# Patient Record
Sex: Female | Born: 1985
Health system: Southern US, Community
[De-identification: ages and names within clinical notes are randomized; demographics above are authoritative.]

## PROBLEM LIST (undated history)

## (undated) DIAGNOSIS — F329 Major depressive disorder, single episode, unspecified: Secondary | ICD-10-CM

## (undated) DIAGNOSIS — R630 Anorexia: Secondary | ICD-10-CM

## (undated) DIAGNOSIS — D649 Anemia, unspecified: Secondary | ICD-10-CM

## (undated) DIAGNOSIS — J189 Pneumonia, unspecified organism: Secondary | ICD-10-CM

## (undated) DIAGNOSIS — R5383 Other fatigue: Secondary | ICD-10-CM

## (undated) DIAGNOSIS — E611 Iron deficiency: Secondary | ICD-10-CM

## (undated) DIAGNOSIS — F32A Depression, unspecified: Secondary | ICD-10-CM

## (undated) DIAGNOSIS — F419 Anxiety disorder, unspecified: Secondary | ICD-10-CM

## (undated) HISTORY — PX: TUBAL LIGATION: SHX77

---

## 2010-09-02 ENCOUNTER — Emergency Department (HOSPITAL_BASED_OUTPATIENT_CLINIC_OR_DEPARTMENT_OTHER)
Admission: EM | Admit: 2010-09-02 | Discharge: 2010-09-02 | Disposition: A | Discharge status: Home / Self Care | Payer: Medicaid Other | Attending: Emergency Medicine | Admitting: Emergency Medicine

## 2010-09-02 DIAGNOSIS — K089 Disorder of teeth and supporting structures, unspecified: Secondary | ICD-10-CM | POA: Insufficient documentation

## 2010-09-02 DIAGNOSIS — K029 Dental caries, unspecified: Secondary | ICD-10-CM | POA: Insufficient documentation

## 2010-11-28 ENCOUNTER — Emergency Department (HOSPITAL_BASED_OUTPATIENT_CLINIC_OR_DEPARTMENT_OTHER)
Admission: EM | Admit: 2010-11-28 | Discharge: 2010-11-28 | Disposition: A | Discharge status: Home / Self Care | Payer: Medicaid Other | Attending: Emergency Medicine | Admitting: Emergency Medicine

## 2010-11-28 DIAGNOSIS — J45909 Unspecified asthma, uncomplicated: Secondary | ICD-10-CM | POA: Insufficient documentation

## 2010-11-28 DIAGNOSIS — K089 Disorder of teeth and supporting structures, unspecified: Secondary | ICD-10-CM | POA: Insufficient documentation

## 2010-11-28 DIAGNOSIS — K029 Dental caries, unspecified: Secondary | ICD-10-CM | POA: Insufficient documentation

## 2011-08-10 ENCOUNTER — Emergency Department (HOSPITAL_BASED_OUTPATIENT_CLINIC_OR_DEPARTMENT_OTHER)
Admission: EM | Admit: 2011-08-10 | Discharge: 2011-08-10 | Disposition: A | Discharge status: Home / Self Care | Payer: Medicaid Other | Attending: Emergency Medicine | Admitting: Emergency Medicine

## 2011-08-10 ENCOUNTER — Encounter (HOSPITAL_BASED_OUTPATIENT_CLINIC_OR_DEPARTMENT_OTHER): Payer: Self-pay

## 2011-08-10 DIAGNOSIS — J45909 Unspecified asthma, uncomplicated: Secondary | ICD-10-CM | POA: Insufficient documentation

## 2011-08-10 DIAGNOSIS — H9209 Otalgia, unspecified ear: Secondary | ICD-10-CM | POA: Insufficient documentation

## 2011-08-10 DIAGNOSIS — K089 Disorder of teeth and supporting structures, unspecified: Secondary | ICD-10-CM | POA: Insufficient documentation

## 2011-08-10 DIAGNOSIS — R220 Localized swelling, mass and lump, head: Secondary | ICD-10-CM | POA: Insufficient documentation

## 2011-08-10 DIAGNOSIS — K029 Dental caries, unspecified: Secondary | ICD-10-CM | POA: Insufficient documentation

## 2011-08-10 MED ORDER — HYDROCODONE-ACETAMINOPHEN 5-500 MG PO TABS: 1.0000 | ORAL_TABLET | Freq: Four times a day (QID) | ORAL | Status: AC | PRN

## 2011-08-10 MED ORDER — BUPIVACAINE-EPINEPHRINE PF 0.5-1:200000 % IJ SOLN
INTRAMUSCULAR | Status: AC
  Administered 2011-08-10: 9 mg
  Filled 2011-08-10: qty 1.8

## 2011-08-10 MED ORDER — BENZOCAINE 20 % MT SOLN
OROMUCOSAL | Status: AC
  Administered 2011-08-10: 10:00:00
  Filled 2011-08-10: qty 57

## 2011-08-10 NOTE — ED Provider Notes (Signed)
History     CSN: [id number]  Arrival date & time 08/10/11  0847   First MD Initiated Contact with Patient 08/10/11 [phone]      Chief Complaint  Patient presents with  . Dental Pain    (Consider location/radiation/quality/duration/timing/severity/associated sxs/prior treatment) Patient is a 26 y.o. female presenting with tooth pain. The history is provided by the patient.  Dental PainThe primary symptoms include mouth pain. Primary symptoms do not include fever, sore throat or cough. Primary symptoms comment: She went to another hospital the last 2 days and was given penicillin and pain medication she states in the past she had a shot in her mouth which improved the pain The symptoms began 2 days ago. The symptoms are worsening. The symptoms are recurrent. The symptoms occur constantly.  Additional symptoms include: dental sensitivity to temperature, gum swelling, gum tenderness and ear pain. Additional symptoms do not include: trouble swallowing, pain with swallowing and drooling. Medical issues include: smoking.    Past Medical History  Diagnosis Date  . Asthma     Past Surgical History  Procedure Date  . Cesarean section     No family history on file.  History  Substance Use Topics  . Smoking status: Current Everyday Smoker  . Smokeless tobacco: Not on file  . Alcohol Use: Yes    OB History    Grav Para Term Preterm Abortions TAB SAB Ect Mult Living                  Review of Systems  Constitutional: Negative for fever.  HENT: Positive for ear pain. Negative for sore throat, drooling and trouble swallowing.   Respiratory: Negative for cough.   All other systems reviewed and are negative.    Allergies  Percocet  Home Medications   Current Outpatient Rx  Name Route Sig Dispense Refill  . HYDROCODONE-ACETAMINOPHEN 5-500 MG PO TABS Oral Take 1 tablet by mouth every 4 (four) hours as needed.    Marland Kitchen PENICILLIN V POTASSIUM 500 MG PO TABS Oral Take 500 mg by mouth 3  (three) times daily.      BP 147/83  Pulse 89  Temp(Src) 98.3 F (36.8 C) (Oral)  Resp 16  Ht 5\' 7"  (1.702 m)  Wt 175 lb (79.379 kg)  BMI 27.41 kg/m2  SpO2 100%  LMP 08/04/2011  Physical Exam  Nursing note and vitals reviewed. Constitutional: She is oriented to person, place, and time. She appears well-developed and well-nourished. She appears distressed.  HENT:  Head: Normocephalic and atraumatic. No trismus in the jaw.  Mouth/Throat: Oropharynx is clear and moist. Dental caries present. No dental abscesses or uvula swelling. No tonsillar abscesses.    Eyes: Conjunctivae and EOM are normal. Pupils are equal, round, and reactive to light.  Lymphadenopathy:    She has no cervical adenopathy.  Neurological: She is alert and oriented to person, place, and time.  Skin: Skin is warm and dry.    ED Course  Dental Date/Time: 08/10/2011 9:50 AM Performed by: Gwyneth Sprout Authorized by: Gwyneth Sprout Consent: Verbal consent obtained. Consent given by: patient Local anesthesia used: yes Anesthesia: local infiltration Local anesthetic: bupivacaine 0.25% without epinephrine and topical anesthetic Anesthetic total: 2 ml Patient sedated: no Patient tolerance: Patient tolerated the procedure well with no immediate complications. Comments: Significant improvement in dental pain   (including critical care time)  Labs Reviewed - No data to display No results found.   No diagnosis found.    MDM   Pt  with dental caries and mild facial swelling.  No signs of ludwig's angina or difficulty swallowing and no systemic symptoms. Moderate relief of dental pain after a dental block. Patient is to continue taking the penicillin and will followup with dentistry.         Gwyneth Sprout, MD 08/10/11 [phone]

## 2011-08-10 NOTE — ED Notes (Signed)
Pt c/o Lower L dental pain.  Pt states she has been to [facility] past two days and given Vicodin and Penicillin.  Pt states she was seen here before and given injection that alleviated pain.

## 2011-11-12 ENCOUNTER — Emergency Department (HOSPITAL_BASED_OUTPATIENT_CLINIC_OR_DEPARTMENT_OTHER)
Admission: EM | Admit: 2011-11-12 | Discharge: 2011-11-12 | Disposition: A | Discharge status: Home / Self Care | Payer: Medicaid Other | Attending: Emergency Medicine | Admitting: Emergency Medicine

## 2011-11-12 ENCOUNTER — Encounter (HOSPITAL_BASED_OUTPATIENT_CLINIC_OR_DEPARTMENT_OTHER): Payer: Self-pay | Admitting: Emergency Medicine

## 2011-11-12 DIAGNOSIS — J45909 Unspecified asthma, uncomplicated: Secondary | ICD-10-CM | POA: Insufficient documentation

## 2011-11-12 DIAGNOSIS — F17200 Nicotine dependence, unspecified, uncomplicated: Secondary | ICD-10-CM | POA: Insufficient documentation

## 2011-11-12 DIAGNOSIS — R599 Enlarged lymph nodes, unspecified: Secondary | ICD-10-CM | POA: Insufficient documentation

## 2011-11-12 DIAGNOSIS — J029 Acute pharyngitis, unspecified: Secondary | ICD-10-CM | POA: Insufficient documentation

## 2011-11-12 HISTORY — DX: Pneumonia, unspecified organism: J18.9

## 2011-11-12 MED ORDER — CEPHALEXIN 500 MG PO CAPS: 500.0000 mg | ORAL_CAPSULE | Freq: Three times a day (TID) | ORAL | Status: AC

## 2011-11-12 MED ORDER — PREDNISONE 10 MG PO TABS: 20.0000 mg | ORAL_TABLET | Freq: Two times a day (BID) | ORAL | Status: DC

## 2011-11-12 NOTE — ED Provider Notes (Signed)
History     CSN: [id number]  Arrival date & time 11/12/11  1002   First MD Initiated Contact with Patient 11/12/11 1130      Chief Complaint  Patient presents with  . Sore Throat    (Consider location/radiation/quality/duration/timing/severity/associated sxs/prior treatment) Patient is a 26 y.o. female presenting with pharyngitis. The history is provided by the patient.  Sore Throat This is a new problem. Episode onset: one week ago. The problem occurs constantly. The problem has been gradually worsening. Pertinent negatives include no shortness of breath. The symptoms are aggravated by swallowing. The symptoms are relieved by nothing. She has tried ASA for the symptoms. The treatment provided no relief.    Past Medical History  Diagnosis Date  . Asthma   . Pneumonia     Past Surgical History  Procedure Date  . Cesarean section     No family history on file.  History  Substance Use Topics  . Smoking status: Current Everyday Smoker  . Smokeless tobacco: Not on file  . Alcohol Use: Yes    OB History    Grav Para Term Preterm Abortions TAB SAB Ect Mult Living                  Review of Systems  Respiratory: Negative for shortness of breath.   All other systems reviewed and are negative.    Allergies  Percocet  Home Medications   Current Outpatient Rx  Name Route Sig Dispense Refill  . HYDROCODONE-ACETAMINOPHEN 5-500 MG PO TABS Oral Take 1 tablet by mouth every 4 (four) hours as needed.    Marland Kitchen PENICILLIN V POTASSIUM 500 MG PO TABS Oral Take 500 mg by mouth 3 (three) times daily.      BP 122/72  Pulse 89  Temp(Src) 98.9 F (37.2 C) (Oral)  Resp 20  SpO2 100%  LMP 10/14/2011  Physical Exam  Nursing note and vitals reviewed. Constitutional: She is oriented to person, place, and time. She appears well-developed and well-nourished. No distress.  HENT:  Head: Normocephalic and atraumatic.  Right Ear: External ear normal.  Left Ear: External ear normal.   Mouth/Throat: Oropharynx is clear and moist.  Neck: Normal range of motion. Neck supple.  Cardiovascular: Normal rate and regular rhythm.  Exam reveals no gallop and no friction rub.   No murmur heard. Pulmonary/Chest: Effort normal and breath sounds normal. No respiratory distress. She has no wheezes.  Abdominal: Soft. Bowel sounds are normal. She exhibits no distension. There is no tenderness.  Musculoskeletal: Normal range of motion.  Lymphadenopathy:    She has cervical adenopathy.  Neurological: She is alert and oriented to person, place, and time.  Skin: Skin is warm and dry. She is not diaphoretic.    ED Course  Procedures (including critical care time)   Labs Reviewed  RAPID STREP SCREEN   No results found.   No diagnosis found.    MDM          Geoffery Lyons, MD 11/12/11 [phone]

## 2011-11-12 NOTE — ED Notes (Signed)
Pt c/o sore throat x 1 week

## 2011-11-12 NOTE — Discharge Instructions (Signed)

## 2012-06-17 ENCOUNTER — Emergency Department (HOSPITAL_BASED_OUTPATIENT_CLINIC_OR_DEPARTMENT_OTHER)
Admission: EM | Admit: 2012-06-17 | Discharge: 2012-06-17 | Discharge status: Against Medical Advice | Payer: Self-pay | Attending: Emergency Medicine | Admitting: Emergency Medicine

## 2012-06-17 ENCOUNTER — Encounter (HOSPITAL_BASED_OUTPATIENT_CLINIC_OR_DEPARTMENT_OTHER): Payer: Self-pay | Admitting: Family Medicine

## 2012-06-17 DIAGNOSIS — L293 Anogenital pruritus, unspecified: Secondary | ICD-10-CM | POA: Insufficient documentation

## 2012-06-17 LAB — URINALYSIS, ROUTINE W REFLEX MICROSCOPIC
Bilirubin Urine: NEGATIVE
Glucose, UA: NEGATIVE mg/dL
Ketones, ur: NEGATIVE mg/dL
pH: 5.5 (ref 5.0–8.0)

## 2012-06-17 NOTE — ED Notes (Signed)
Pt c/o vaginal itching and discharge x 2 days. Pt sts she tried monistat last night.

## 2012-06-18 ENCOUNTER — Encounter (HOSPITAL_BASED_OUTPATIENT_CLINIC_OR_DEPARTMENT_OTHER): Payer: Self-pay | Admitting: *Deleted

## 2012-06-18 ENCOUNTER — Emergency Department (HOSPITAL_BASED_OUTPATIENT_CLINIC_OR_DEPARTMENT_OTHER)
Admission: EM | Admit: 2012-06-18 | Discharge: 2012-06-18 | Disposition: A | Discharge status: Home / Self Care | Payer: Medicaid Other | Attending: Emergency Medicine | Admitting: Emergency Medicine

## 2012-06-18 DIAGNOSIS — J45909 Unspecified asthma, uncomplicated: Secondary | ICD-10-CM | POA: Insufficient documentation

## 2012-06-18 DIAGNOSIS — Z8701 Personal history of pneumonia (recurrent): Secondary | ICD-10-CM | POA: Insufficient documentation

## 2012-06-18 DIAGNOSIS — B3731 Acute candidiasis of vulva and vagina: Secondary | ICD-10-CM | POA: Insufficient documentation

## 2012-06-18 DIAGNOSIS — Z3202 Encounter for pregnancy test, result negative: Secondary | ICD-10-CM | POA: Insufficient documentation

## 2012-06-18 DIAGNOSIS — F17200 Nicotine dependence, unspecified, uncomplicated: Secondary | ICD-10-CM | POA: Insufficient documentation

## 2012-06-18 DIAGNOSIS — B373 Candidiasis of vulva and vagina: Secondary | ICD-10-CM

## 2012-06-18 DIAGNOSIS — Z79899 Other long term (current) drug therapy: Secondary | ICD-10-CM | POA: Insufficient documentation

## 2012-06-18 DIAGNOSIS — N898 Other specified noninflammatory disorders of vagina: Secondary | ICD-10-CM | POA: Insufficient documentation

## 2012-06-18 DIAGNOSIS — N760 Acute vaginitis: Secondary | ICD-10-CM | POA: Insufficient documentation

## 2012-06-18 LAB — WET PREP, GENITAL

## 2012-06-18 LAB — URINALYSIS, ROUTINE W REFLEX MICROSCOPIC
Glucose, UA: NEGATIVE mg/dL
Leukocytes, UA: NEGATIVE
pH: 5 (ref 5.0–8.0)

## 2012-06-18 MED ORDER — METRONIDAZOLE 500 MG PO TABS: 500.0000 mg | ORAL_TABLET | Freq: Two times a day (BID) | ORAL | Status: DC

## 2012-06-18 MED ORDER — FLUCONAZOLE 150 MG PO TABS: 150.0000 mg | ORAL_TABLET | Freq: Once | ORAL | Status: DC

## 2012-06-18 NOTE — ED Notes (Signed)
Pelvic cart is set up and ready for the doctor to use. The patient is undressed from the waist down and in a gown.

## 2012-06-18 NOTE — ED Notes (Signed)
PA at bedside.

## 2012-06-18 NOTE — ED Provider Notes (Signed)
History     CSN: [id number]  Arrival date & time 06/18/12  1332   First MD Initiated Contact with Patient 06/18/12 1354      Chief Complaint  Patient presents with  . Vaginal Itching    (Consider location/radiation/quality/duration/timing/severity/associated sxs/prior treatment) HPI Comments: Patient is a 26 year old female who presents with a 2 day history of vaginal itching and burning. Patient reports gradual onset and progressive worsening of symptoms since the onset. Patient has not tried anything for symptoms. No aggravating/alleviating factors. Associated symptoms include vaginal discharge.   Patient is a 26 y.o. female presenting with vaginal itching.  Vaginal Itching    Past Medical History  Diagnosis Date  . Asthma   . Pneumonia     Past Surgical History  Procedure Date  . Cesarean section     No family history on file.  History  Substance Use Topics  . Smoking status: Current Every Day Smoker -- 1.0 packs/day    Types: Cigarettes  . Smokeless tobacco: Not on file  . Alcohol Use: Yes    OB History    Grav Para Term Preterm Abortions TAB SAB Ect Mult Living                  Review of Systems  Genitourinary: Positive for vaginal discharge.  All other systems reviewed and are negative.    Allergies  Percocet  Home Medications   Current Outpatient Rx  Name  Route  Sig  Dispense  Refill  . FLUCONAZOLE 150 MG PO TABS   Oral   Take 1 tablet (150 mg total) by mouth once.   1 tablet   0   . HYDROCODONE-ACETAMINOPHEN 5-500 MG PO TABS   Oral   Take 1 tablet by mouth every 4 (four) hours as needed.         Marland Kitchen METRONIDAZOLE 500 MG PO TABS   Oral   Take 1 tablet (500 mg total) by mouth 2 (two) times daily.   14 tablet   0   . PENICILLIN V POTASSIUM 500 MG PO TABS   Oral   Take 500 mg by mouth 3 (three) times daily.         Marland Kitchen PREDNISONE 10 MG PO TABS   Oral   Take 2 tablets (20 mg total) by mouth 2 (two) times daily.   12 tablet    0     BP 125/64  Pulse 88  Temp 98.8 F (37.1 C) (Oral)  Resp 18  SpO2 100%  LMP 05/18/2012  Physical Exam  Nursing note and vitals reviewed. Constitutional: She is oriented to person, place, and time. She appears well-developed and well-nourished. No distress.  HENT:  Head: Normocephalic and atraumatic.  Eyes: Conjunctivae normal are normal.  Neck: Normal range of motion. Neck supple.  Cardiovascular: Normal rate and regular rhythm.  Exam reveals no gallop and no friction rub.   No murmur heard. Pulmonary/Chest: Effort normal and breath sounds normal. She has no wheezes. She has no rales. She exhibits no tenderness.  Abdominal: Soft. She exhibits no distension. There is no tenderness. There is no rebound and no guarding.  Genitourinary:       Copious white vaginal discharge. No cervical motion tenderness. No adnexal tenderness, masses palpated on bimanual exam.  Musculoskeletal: Normal range of motion.  Neurological: She is alert and oriented to person, place, and time. Coordination normal.       Speech is goal-oriented. Moves limbs without ataxia.  Skin: Skin is warm and dry. She is not diaphoretic.  Psychiatric: She has a normal mood and affect. Her behavior is normal.    ED Course  Procedures (including critical care time)  Labs Reviewed  WET PREP, GENITAL - Abnormal; Notable for the following:    Yeast Wet Prep HPF POC MANY (*)     Clue Cells Wet Prep HPF POC MODERATE (*)     WBC, Wet Prep HPF POC MANY (*)     All other components within normal limits  PREGNANCY, URINE  URINALYSIS, ROUTINE W REFLEX MICROSCOPIC  GC/CHLAMYDIA PROBE AMP   No results found.   1. Yeast vaginitis   2. BV (bacterial vaginosis)       MDM  3:31 PM Patient;s wet prep results show yeast and clue cells. I will treat her for both infections. Patient will be discharged and instructed to return with worsening or concerning symptoms.         Emilia Beck, PA-C 06/18/12  1538

## 2012-06-18 NOTE — ED Provider Notes (Signed)
Medical screening examination/treatment/procedure(s) were performed by non-physician practitioner and as supervising physician I was immediately available for consultation/collaboration.  Jones Skene, M.D.     Jones Skene, MD 06/18/12 1639

## 2012-06-18 NOTE — ED Notes (Signed)
Vaginal itching and burning x 2 days.

## 2012-09-10 ENCOUNTER — Emergency Department (HOSPITAL_BASED_OUTPATIENT_CLINIC_OR_DEPARTMENT_OTHER)
Admission: EM | Admit: 2012-09-10 | Discharge: 2012-09-10 | Disposition: A | Discharge status: Home / Self Care | Payer: Medicaid Other | Attending: Emergency Medicine | Admitting: Emergency Medicine

## 2012-09-10 ENCOUNTER — Encounter (HOSPITAL_BASED_OUTPATIENT_CLINIC_OR_DEPARTMENT_OTHER): Payer: Self-pay | Admitting: *Deleted

## 2012-09-10 DIAGNOSIS — Z8701 Personal history of pneumonia (recurrent): Secondary | ICD-10-CM | POA: Insufficient documentation

## 2012-09-10 DIAGNOSIS — Z79899 Other long term (current) drug therapy: Secondary | ICD-10-CM | POA: Insufficient documentation

## 2012-09-10 DIAGNOSIS — K0889 Other specified disorders of teeth and supporting structures: Secondary | ICD-10-CM

## 2012-09-10 DIAGNOSIS — F17200 Nicotine dependence, unspecified, uncomplicated: Secondary | ICD-10-CM | POA: Insufficient documentation

## 2012-09-10 DIAGNOSIS — J45909 Unspecified asthma, uncomplicated: Secondary | ICD-10-CM | POA: Insufficient documentation

## 2012-09-10 DIAGNOSIS — K089 Disorder of teeth and supporting structures, unspecified: Secondary | ICD-10-CM | POA: Insufficient documentation

## 2012-09-10 MED ORDER — HYDROCODONE-ACETAMINOPHEN 5-325 MG PO TABS
ORAL_TABLET | ORAL | Status: AC
  Administered 2012-09-10: 17:00:00
  Filled 2012-09-10: qty 1

## 2012-09-10 MED ORDER — HYDROCODONE-ACETAMINOPHEN 5-325 MG PO TABS: 2.0000 | ORAL_TABLET | ORAL | Status: DC | PRN

## 2012-09-10 NOTE — ED Provider Notes (Signed)
History     CSN: [id number]  Arrival date & time 09/10/12  1630   None     Chief Complaint  Patient presents with  . Dental Pain    (Consider location/radiation/quality/duration/timing/severity/associated sxs/prior treatment) HPI Comments: Pt states that her doctor put her on amoxicillin in the last couple of days  Patient is a 27 y.o. female presenting with tooth pain. The history is provided by the patient. No language interpreter was used.  Dental PainThe primary symptoms include mouth pain. Primary symptoms do not include fever. The symptoms began 2 days ago. The symptoms are unchanged. The symptoms occur constantly.  Medical issues include: smoking.    Past Medical History  Diagnosis Date  . Asthma   . Pneumonia     Past Surgical History  Procedure Laterality Date  . Cesarean section      No family history on file.  History  Substance Use Topics  . Smoking status: Current Every Day Smoker -- 1.00 packs/day    Types: Cigarettes  . Smokeless tobacco: Not on file  . Alcohol Use: Yes    OB History   Grav Para Term Preterm Abortions TAB SAB Ect Mult Living                  Review of Systems  Constitutional: Negative for fever.  Respiratory: Negative.   Cardiovascular: Negative.     Allergies  Percocet  Home Medications   Current Outpatient Rx  Name  Route  Sig  Dispense  Refill  . fluconazole (DIFLUCAN) 150 MG tablet   Oral   Take 1 tablet (150 mg total) by mouth once.   1 tablet   0   . HYDROcodone-acetaminophen (NORCO/VICODIN) 5-325 MG per tablet   Oral   Take 2 tablets by mouth every 4 (four) hours as needed for pain.   10 tablet   0   . HYDROcodone-acetaminophen (VICODIN) 5-500 MG per tablet   Oral   Take 1 tablet by mouth every 4 (four) hours as needed.         . metroNIDAZOLE (FLAGYL) 500 MG tablet   Oral   Take 1 tablet (500 mg total) by mouth 2 (two) times daily.   14 tablet   0   . penicillin v potassium (VEETID) 500 MG  tablet   Oral   Take 500 mg by mouth 3 (three) times daily.         . predniSONE (DELTASONE) 10 MG tablet   Oral   Take 2 tablets (20 mg total) by mouth 2 (two) times daily.   12 tablet   0     BP 148/100  Pulse 78  Temp(Src) 98 F (36.7 C) (Oral)  Resp 20  Wt 179 lb (81.194 kg)  BMI 28.03 kg/m2  SpO2 100%  Physical Exam  Nursing note and vitals reviewed. Constitutional: She appears well-developed and well-nourished.  HENT:  Right Ear: External ear normal.  Left Ear: External ear normal.  Pt has multiple decayed and broken teeth  Cardiovascular: Normal rate and regular rhythm.   Pulmonary/Chest: Effort normal and breath sounds normal.    ED Course  Procedures (including critical care time)  Labs Reviewed - No data to display No results found.   1. Toothache       MDM  Pt already on antibiotics;will give hydrocodone and referral to dentist        Teressa Lower, NP 09/10/12 1712

## 2012-09-10 NOTE — ED Notes (Signed)
Toothache x 2 days 

## 2012-09-10 NOTE — ED Notes (Signed)
Pt reports she was seen by PMD yesterday, diagnosed with a dental abscess, given an Amoxicillin and Percocet but continues to have pain.

## 2012-09-11 NOTE — ED Provider Notes (Signed)
Medical screening examination/treatment/procedure(s) were performed by non-physician practitioner and as supervising physician I was immediately available for consultation/collaboration.  Geoffery Lyons, MD 09/11/12 [phone]

## 2012-11-09 ENCOUNTER — Emergency Department (HOSPITAL_BASED_OUTPATIENT_CLINIC_OR_DEPARTMENT_OTHER)
Admission: EM | Admit: 2012-11-09 | Discharge: 2012-11-09 | Disposition: A | Discharge status: Home / Self Care | Payer: Medicaid Other | Attending: Emergency Medicine | Admitting: Emergency Medicine

## 2012-11-09 ENCOUNTER — Encounter (HOSPITAL_BASED_OUTPATIENT_CLINIC_OR_DEPARTMENT_OTHER): Payer: Self-pay | Admitting: *Deleted

## 2012-11-09 DIAGNOSIS — N949 Unspecified condition associated with female genital organs and menstrual cycle: Secondary | ICD-10-CM | POA: Insufficient documentation

## 2012-11-09 DIAGNOSIS — IMO0002 Reserved for concepts with insufficient information to code with codable children: Secondary | ICD-10-CM | POA: Insufficient documentation

## 2012-11-09 DIAGNOSIS — R82998 Other abnormal findings in urine: Secondary | ICD-10-CM | POA: Insufficient documentation

## 2012-11-09 DIAGNOSIS — Z3202 Encounter for pregnancy test, result negative: Secondary | ICD-10-CM | POA: Insufficient documentation

## 2012-11-09 DIAGNOSIS — R8271 Bacteriuria: Secondary | ICD-10-CM

## 2012-11-09 DIAGNOSIS — R109 Unspecified abdominal pain: Secondary | ICD-10-CM | POA: Insufficient documentation

## 2012-11-09 DIAGNOSIS — N898 Other specified noninflammatory disorders of vagina: Secondary | ICD-10-CM | POA: Insufficient documentation

## 2012-11-09 DIAGNOSIS — J45909 Unspecified asthma, uncomplicated: Secondary | ICD-10-CM | POA: Insufficient documentation

## 2012-11-09 DIAGNOSIS — F17200 Nicotine dependence, unspecified, uncomplicated: Secondary | ICD-10-CM | POA: Insufficient documentation

## 2012-11-09 DIAGNOSIS — Z79899 Other long term (current) drug therapy: Secondary | ICD-10-CM | POA: Insufficient documentation

## 2012-11-09 DIAGNOSIS — Z8701 Personal history of pneumonia (recurrent): Secondary | ICD-10-CM | POA: Insufficient documentation

## 2012-11-09 LAB — URINALYSIS, ROUTINE W REFLEX MICROSCOPIC
Bilirubin Urine: NEGATIVE
Nitrite: NEGATIVE
Protein, ur: NEGATIVE mg/dL
Specific Gravity, Urine: 1.022 (ref 1.005–1.030)
Urobilinogen, UA: 1 mg/dL (ref 0.0–1.0)

## 2012-11-09 LAB — URINE MICROSCOPIC-ADD ON

## 2012-11-09 LAB — WET PREP, GENITAL

## 2012-11-09 MED ORDER — IBUPROFEN 800 MG PO TABS: 800.0000 mg | ORAL_TABLET | Freq: Three times a day (TID) | ORAL | Status: DC

## 2012-11-09 MED ORDER — LIDOCAINE HCL (PF) 1 % IJ SOLN
INTRAMUSCULAR | Status: AC
  Administered 2012-11-09: 2.1 mL
  Filled 2012-11-09: qty 5

## 2012-11-09 MED ORDER — AZITHROMYCIN 250 MG PO TABS
1000.0000 mg | ORAL_TABLET | Freq: Once | ORAL | Status: AC
  Administered 2012-11-09: 1000 mg via ORAL
  Filled 2012-11-09: qty 4

## 2012-11-09 MED ORDER — CEFTRIAXONE SODIUM 250 MG IJ SOLR
250.0000 mg | Freq: Once | INTRAMUSCULAR | Status: AC
  Administered 2012-11-09: 250 mg via INTRAMUSCULAR
  Filled 2012-11-09: qty 250

## 2012-11-09 NOTE — ED Notes (Signed)
Patient having vaginal white discharge/ some burning for the past two days, unprotected sex

## 2012-11-09 NOTE — ED Provider Notes (Signed)
History     CSN: [id number]  Arrival date & time 11/09/12  0930   First MD Initiated Contact with Patient 11/09/12 [phone]      Chief Complaint  Patient presents with  . Vaginal Discharge    (Consider location/radiation/quality/duration/timing/severity/associated sxs/prior treatment) Patient is a 27 y.o. female presenting with vaginal discharge. The history is provided by the patient.  Vaginal Discharge This is a new problem. The current episode started in the past 7 days. The problem occurs constantly. Associated symptoms include abdominal pain. Pertinent negatives include no chest pain, fever, nausea or vomiting. Associated symptoms comments: Vaginal discharge with "fishy odor" for 2 days associated with lower abdominal pain. No dysuria, frequency, hematuria. No N, V, fever, or change in bowel habits. She states her baby's father has recently admitted to having a sexual relationship with another woman but denies symptoms..    Past Medical History  Diagnosis Date  . Asthma   . Pneumonia     Past Surgical History  Procedure Laterality Date  . Cesarean section      No family history on file.  History  Substance Use Topics  . Smoking status: Current Every Day Smoker -- 1.00 packs/day    Types: Cigarettes  . Smokeless tobacco: Not on file  . Alcohol Use: Yes    OB History   Grav Para Term Preterm Abortions TAB SAB Ect Mult Living                  Review of Systems  Constitutional: Negative for fever.  Respiratory: Negative for shortness of breath.   Cardiovascular: Negative for chest pain.  Gastrointestinal: Positive for abdominal pain. Negative for nausea and vomiting.  Genitourinary: Positive for vaginal discharge and pelvic pain. Negative for dysuria, frequency, flank pain and vaginal bleeding.    Allergies  Percocet  Home Medications   Current Outpatient Rx  Name  Route  Sig  Dispense  Refill  . fluconazole (DIFLUCAN) 150 MG tablet   Oral   Take 1 tablet  (150 mg total) by mouth once.   1 tablet   0   . HYDROcodone-acetaminophen (NORCO/VICODIN) 5-325 MG per tablet   Oral   Take 2 tablets by mouth every 4 (four) hours as needed for pain.   10 tablet   0   . HYDROcodone-acetaminophen (VICODIN) 5-500 MG per tablet   Oral   Take 1 tablet by mouth every 4 (four) hours as needed.         . metroNIDAZOLE (FLAGYL) 500 MG tablet   Oral   Take 1 tablet (500 mg total) by mouth 2 (two) times daily.   14 tablet   0   . penicillin v potassium (VEETID) 500 MG tablet   Oral   Take 500 mg by mouth 3 (three) times daily.         . predniSONE (DELTASONE) 10 MG tablet   Oral   Take 2 tablets (20 mg total) by mouth 2 (two) times daily.   12 tablet   0     BP 126/70  Pulse 80  Temp(Src) 98.5 F (36.9 C) (Oral)  Resp 16  Ht 5\' 7"  (1.702 m)  Wt 172 lb (78.019 kg)  BMI 26.93 kg/m2  SpO2 100%  Physical Exam  Constitutional: She is oriented to person, place, and time. She appears well-developed and well-nourished.  HENT:  Head: Normocephalic.  Neck: Normal range of motion. Neck supple.  Cardiovascular: Normal rate and regular rhythm.   Pulmonary/Chest: Effort  normal and breath sounds normal.  Abdominal: Soft. Bowel sounds are normal. There is no tenderness. There is no rebound and no guarding.  Genitourinary: Uterus normal. Vaginal discharge found.  No adnexal mass or tenderness. She has mild CMT. Cervix is unremarkable in appearance. Moderate cervical discharge that is white without cottage cheese appearance. No malodor.  Musculoskeletal: Normal range of motion.  Neurological: She is alert and oriented to person, place, and time.  Skin: Skin is warm and dry. No rash noted.  Psychiatric: She has a normal mood and affect.    ED Course  Procedures (including critical care time)  Labs Reviewed  WET PREP, GENITAL - Abnormal; Notable for the following:    Clue Cells Wet Prep HPF POC FEW (*)    WBC, Wet Prep HPF POC FEW (*)    All  other components within normal limits  URINALYSIS, ROUTINE W REFLEX MICROSCOPIC - Abnormal; Notable for the following:    APPearance CLOUDY (*)    Leukocytes, UA MODERATE (*)    All other components within normal limits  URINE MICROSCOPIC-ADD ON - Abnormal; Notable for the following:    Squamous Epithelial / LPF FEW (*)    Bacteria, UA MANY (*)    All other components within normal limits  GC/CHLAMYDIA PROBE AMP  URINE CULTURE  PREGNANCY, URINE   Results for orders placed during the hospital encounter of 11/09/12  WET PREP, GENITAL      Result Value Range   Yeast Wet Prep HPF POC NONE SEEN  NONE SEEN   Trich, Wet Prep NONE SEEN  NONE SEEN   Clue Cells Wet Prep HPF POC FEW (*) NONE SEEN   WBC, Wet Prep HPF POC FEW (*) NONE SEEN  URINALYSIS, ROUTINE W REFLEX MICROSCOPIC      Result Value Range   Color, Urine YELLOW  YELLOW   APPearance CLOUDY (*) CLEAR   Specific Gravity, Urine 1.022  1.005 - 1.030   pH 6.0  5.0 - 8.0   Glucose, UA NEGATIVE  NEGATIVE mg/dL   Hgb urine dipstick NEGATIVE  NEGATIVE   Bilirubin Urine NEGATIVE  NEGATIVE   Ketones, ur NEGATIVE  NEGATIVE mg/dL   Protein, ur NEGATIVE  NEGATIVE mg/dL   Urobilinogen, UA 1.0  0.0 - 1.0 mg/dL   Nitrite NEGATIVE  NEGATIVE   Leukocytes, UA MODERATE (*) NEGATIVE  PREGNANCY, URINE      Result Value Range   Preg Test, Ur NEGATIVE  NEGATIVE  URINE MICROSCOPIC-ADD ON      Result Value Range   Squamous Epithelial / LPF FEW (*) RARE   WBC, UA 7-10  <3 WBC/hpf   RBC / HPF 0-2  <3 RBC/hpf   Bacteria, UA MANY (*) RARE   Urine-Other MUCOUS PRESENT      No results found.   No diagnosis found.  1. Vaginal discharge 2. Asymptomatic bacteriuria   MDM  Will treat with STD medications based on exposure per history. Urine culture pending but untreated abnormal urine as she is having no urinary symptoms.        Arnoldo Hooker, PA-C 11/09/12 1126

## 2012-11-10 LAB — GC/CHLAMYDIA PROBE AMP: GC Probe RNA: NEGATIVE

## 2012-11-10 NOTE — ED Provider Notes (Signed)
Medical screening examination/treatment/procedure(s) were performed by non-physician practitioner and as supervising physician I was immediately available for consultation/collaboration.    Shelda Jakes, MD 11/10/12 [phone]

## 2012-11-12 LAB — URINE CULTURE

## 2012-11-13 NOTE — ED Notes (Signed)
+   Urine Patient received shot of rocephin and Zithromax but no rx given to take home.Chart sent to EDP office for review.

## 2012-11-14 ENCOUNTER — Telehealth (HOSPITAL_COMMUNITY): Payer: Self-pay | Admitting: Emergency Medicine

## 2012-11-14 NOTE — ED Notes (Signed)
Chart returned from EDP office. Per Emily West PA-C, likely contaminant. No further abx tx. °

## 2012-11-29 ENCOUNTER — Emergency Department (HOSPITAL_BASED_OUTPATIENT_CLINIC_OR_DEPARTMENT_OTHER)
Admission: EM | Admit: 2012-11-29 | Discharge: 2012-11-29 | Disposition: A | Discharge status: Home / Self Care | Payer: Medicaid Other | Attending: Emergency Medicine | Admitting: Emergency Medicine

## 2012-11-29 ENCOUNTER — Encounter (HOSPITAL_BASED_OUTPATIENT_CLINIC_OR_DEPARTMENT_OTHER): Payer: Self-pay | Admitting: Emergency Medicine

## 2012-11-29 DIAGNOSIS — Z8701 Personal history of pneumonia (recurrent): Secondary | ICD-10-CM | POA: Insufficient documentation

## 2012-11-29 DIAGNOSIS — R21 Rash and other nonspecific skin eruption: Secondary | ICD-10-CM | POA: Insufficient documentation

## 2012-11-29 DIAGNOSIS — IMO0002 Reserved for concepts with insufficient information to code with codable children: Secondary | ICD-10-CM | POA: Insufficient documentation

## 2012-11-29 DIAGNOSIS — L259 Unspecified contact dermatitis, unspecified cause: Secondary | ICD-10-CM | POA: Insufficient documentation

## 2012-11-29 DIAGNOSIS — M545 Low back pain, unspecified: Secondary | ICD-10-CM | POA: Insufficient documentation

## 2012-11-29 DIAGNOSIS — J45909 Unspecified asthma, uncomplicated: Secondary | ICD-10-CM | POA: Insufficient documentation

## 2012-11-29 DIAGNOSIS — Z3202 Encounter for pregnancy test, result negative: Secondary | ICD-10-CM | POA: Insufficient documentation

## 2012-11-29 DIAGNOSIS — L309 Dermatitis, unspecified: Secondary | ICD-10-CM

## 2012-11-29 DIAGNOSIS — M549 Dorsalgia, unspecified: Secondary | ICD-10-CM

## 2012-11-29 DIAGNOSIS — F17200 Nicotine dependence, unspecified, uncomplicated: Secondary | ICD-10-CM | POA: Insufficient documentation

## 2012-11-29 LAB — WET PREP, GENITAL
Trich, Wet Prep: NONE SEEN
Yeast Wet Prep HPF POC: NONE SEEN

## 2012-11-29 LAB — URINALYSIS, ROUTINE W REFLEX MICROSCOPIC
Bilirubin Urine: NEGATIVE
Glucose, UA: NEGATIVE mg/dL
Hgb urine dipstick: NEGATIVE
Ketones, ur: NEGATIVE mg/dL
Nitrite: NEGATIVE
pH: 6.5 (ref 5.0–8.0)

## 2012-11-29 MED ORDER — TRAMADOL HCL 50 MG PO TABS: 50.0000 mg | ORAL_TABLET | Freq: Four times a day (QID) | ORAL | Status: DC | PRN

## 2012-11-29 MED ORDER — HYDROXYZINE HCL 25 MG PO TABS: 25.0000 mg | ORAL_TABLET | Freq: Four times a day (QID) | ORAL | Status: DC | PRN

## 2012-11-29 MED ORDER — CLOTRIMAZOLE-BETAMETHASONE 1-0.05 % EX CREA: TOPICAL_CREAM | CUTANEOUS | Status: DC

## 2012-11-29 NOTE — ED Notes (Addendum)
Pt having right flank pain x 2-3 days.  Some vaginal irritation and painful intercourse.  No vaginal discharge.  No painful urination.   Pt also c/o rash to arm and knee.  Noted dried raised area in triangle pattern to upper right arm.  Pt states it was itching.

## 2012-11-29 NOTE — ED Provider Notes (Signed)
History     CSN: [id number]  Arrival date & time 11/29/12  1047   First MD Initiated Contact with Patient 11/29/12 1110      Chief Complaint  Patient presents with  . Flank Pain  . Rash    (Consider location/radiation/quality/duration/timing/severity/associated sxs/prior treatment) HPI Comments: Patient comes to the ER with multiple complaints. Patient reports that she has an itchy rash on her extremities. It has been present for more than a week. She went to another ER and was told to use hydrocortisone cream. The area is "drying out" but is still very itchy. She has one lesion on her right forearm, another on her left knee and has some small areas on the other parts of her extremities that are itchy. She says she has to go outside or in the woods. Hasn't used any new skin products. She is concerned that it might be ringworm.  Patient also complaining of pain in the right flank area. It is mostly back, right lower region. She has not noticed any urinary frequency, urgency or hematuria. Pain worsens with movement. She denies any injury.  Patient is a 27 y.o. female presenting with flank pain and rash.  Flank Pain  Rash   Past Medical History  Diagnosis Date  . Asthma   . Pneumonia     Past Surgical History  Procedure Laterality Date  . Cesarean section    . Tubal ligation      No family history on file.  History  Substance Use Topics  . Smoking status: Current Every Day Smoker -- 1.00 packs/day    Types: Cigarettes  . Smokeless tobacco: Not on file  . Alcohol Use: Yes    OB History   Grav Para Term Preterm Abortions TAB SAB Ect Mult Living                  Review of Systems  Genitourinary: Positive for flank pain.  Musculoskeletal: Positive for back pain.  Skin: Positive for rash.  All other systems reviewed and are negative.    Allergies  Percocet  Home Medications  No current outpatient prescriptions on file.  BP 131/86  Pulse 82  Temp(Src) 98.8  F (37.1 C) (Oral)  Resp 16  Ht 5\' 7"  (1.702 m)  Wt 175 lb (79.379 kg)  BMI 27.4 kg/m2  SpO2 100%  LMP 11/26/2012  Physical Exam  Constitutional: She is oriented to person, place, and time. She appears well-developed and well-nourished. No distress.  HENT:  Head: Normocephalic and atraumatic.  Right Ear: Hearing normal.  Left Ear: Hearing normal.  Nose: Nose normal.  Mouth/Throat: Oropharynx is clear and moist and mucous membranes are normal.  Eyes: Conjunctivae and EOM are normal. Pupils are equal, round, and reactive to light.  Neck: Normal range of motion. Neck supple.  Cardiovascular: Regular rhythm, S1 normal and S2 normal.  Exam reveals no gallop and no friction rub.   No murmur heard. Pulmonary/Chest: Effort normal and breath sounds normal. No respiratory distress. She exhibits no tenderness.  Abdominal: Soft. Normal appearance and bowel sounds are normal. There is no hepatosplenomegaly. There is no tenderness. There is no rebound, no guarding, no tenderness at McBurney's point and negative Murphy's sign. No hernia.  Musculoskeletal: Normal range of motion.       Lumbar back: She exhibits no bony tenderness.       Arms: Neurological: She is alert and oriented to person, place, and time. She has normal strength. No cranial nerve deficit or  sensory deficit. Coordination normal. GCS eye subscore is 4. GCS verbal subscore is 5. GCS motor subscore is 6.  Skin: Skin is warm, dry and intact. No rash noted. No cyanosis.  Several patchy, scaly, slightly raised and erythematous lesions on right forearm and left knee. No drainage.  Psychiatric: She has a normal mood and affect. Her speech is normal and behavior is normal. Thought content normal.    ED Course  Procedures (including critical care time)  Labs Reviewed  URINALYSIS, ROUTINE W REFLEX MICROSCOPIC  PREGNANCY, URINE   No results found.   Diagnosis: 1. Back pain 2. Skin rash    MDM  She has a rash on her skin of  unclear etiology. It looks inflammatory possibly allergic, does not appear to be bacterial infection. Patient concerned about ringworm, or possibly the tibia based. Will prescribe Lotrisone topically.  Is also having some pain in the right flank and back area. Soft tissue tenderness in the paraspinal muscles in this region and it is painful to move. Urinalysis was clear, no sign of urinary or kidney infection.  Examination shows  No clear evidence of PID. Patient does report some pain with intercourse. Cultures are pending. She will be scheduled to followup with OB/GYN.       Gilda Crease, MD 11/29/12 [phone]

## 2012-11-29 NOTE — ED Notes (Signed)
Pelvic cart is at the bedside set up and ready for the doctor to use. 

## 2012-11-30 LAB — GC/CHLAMYDIA PROBE AMP
CT Probe RNA: NEGATIVE
GC Probe RNA: NEGATIVE

## 2013-01-10 ENCOUNTER — Encounter (HOSPITAL_BASED_OUTPATIENT_CLINIC_OR_DEPARTMENT_OTHER): Payer: Self-pay | Admitting: *Deleted

## 2013-01-10 ENCOUNTER — Emergency Department (HOSPITAL_BASED_OUTPATIENT_CLINIC_OR_DEPARTMENT_OTHER)
Admission: EM | Admit: 2013-01-10 | Discharge: 2013-01-10 | Disposition: A | Discharge status: Home / Self Care | Payer: Medicaid Other | Attending: Emergency Medicine | Admitting: Emergency Medicine

## 2013-01-10 DIAGNOSIS — M25569 Pain in unspecified knee: Secondary | ICD-10-CM | POA: Insufficient documentation

## 2013-01-10 DIAGNOSIS — Z8701 Personal history of pneumonia (recurrent): Secondary | ICD-10-CM | POA: Insufficient documentation

## 2013-01-10 DIAGNOSIS — M222X1 Patellofemoral disorders, right knee: Secondary | ICD-10-CM

## 2013-01-10 DIAGNOSIS — R109 Unspecified abdominal pain: Secondary | ICD-10-CM | POA: Insufficient documentation

## 2013-01-10 DIAGNOSIS — Z3202 Encounter for pregnancy test, result negative: Secondary | ICD-10-CM | POA: Insufficient documentation

## 2013-01-10 DIAGNOSIS — N949 Unspecified condition associated with female genital organs and menstrual cycle: Secondary | ICD-10-CM | POA: Insufficient documentation

## 2013-01-10 DIAGNOSIS — J45909 Unspecified asthma, uncomplicated: Secondary | ICD-10-CM | POA: Insufficient documentation

## 2013-01-10 DIAGNOSIS — F17200 Nicotine dependence, unspecified, uncomplicated: Secondary | ICD-10-CM | POA: Insufficient documentation

## 2013-01-10 DIAGNOSIS — R102 Pelvic and perineal pain: Secondary | ICD-10-CM

## 2013-01-10 DIAGNOSIS — Z79899 Other long term (current) drug therapy: Secondary | ICD-10-CM | POA: Insufficient documentation

## 2013-01-10 LAB — URINALYSIS, ROUTINE W REFLEX MICROSCOPIC
Bilirubin Urine: NEGATIVE
Hgb urine dipstick: NEGATIVE
Ketones, ur: NEGATIVE mg/dL
Nitrite: NEGATIVE
Protein, ur: NEGATIVE mg/dL
Urobilinogen, UA: 0.2 mg/dL (ref 0.0–1.0)

## 2013-01-10 LAB — URINE MICROSCOPIC-ADD ON

## 2013-01-10 MED ORDER — TRAMADOL HCL 50 MG PO TABS: 50.0000 mg | ORAL_TABLET | Freq: Four times a day (QID) | ORAL | Status: DC | PRN

## 2013-01-10 MED ORDER — LIDOCAINE HCL (PF) 1 % IJ SOLN
INTRAMUSCULAR | Status: AC
  Administered 2013-01-10: 5 mL
  Filled 2013-01-10: qty 5

## 2013-01-10 MED ORDER — CEFTRIAXONE SODIUM 250 MG IJ SOLR
250.0000 mg | Freq: Once | INTRAMUSCULAR | Status: AC
  Administered 2013-01-10: 250 mg via INTRAMUSCULAR
  Filled 2013-01-10: qty 250

## 2013-01-10 MED ORDER — AZITHROMYCIN 1 G PO PACK
1.0000 g | PACK | Freq: Once | ORAL | Status: AC
  Administered 2013-01-10: 1 g via ORAL
  Filled 2013-01-10: qty 1

## 2013-01-10 NOTE — ED Provider Notes (Signed)
History    CSN: [id number] Arrival date & time 01/10/13  0904  First MD Initiated Contact with Patient 01/10/13 0915     Chief Complaint  Patient presents with  . Abdominal Pain  . Knee Pain   (Consider location/radiation/quality/duration/timing/severity/associated sxs/prior Treatment) HPI 27 year old female presents with 2 complaints, she states her GYN doctor just retired last week and she has had one month of vaginal discharge and pelvic pain 24 hours a day, her Dr. prescribed Cipro and Flagyl which she only took for a couple of days early in the month but since then her symptoms have not resolved so started taking the meds again the last few days but was supposed to be on for 2 weeks initially and she did not take it as prescribed, she also has a couple years of intermittent right anterior knee pain for days to weeks at a time worse with certain position changes some days she has no pain whatsoever other days she has severe pain all day, she has mild pain worse with extension or prolonged walking or prolonged squatting or multiple movements but no specific trauma no redness no swelling no weakness no numbness and no instability locking or giving way, she is no fever rash lightheadedness chest pain shortness breath upper abdominal pain vomiting or diarrhea she is no vaginal bleeding but has had one month of brown vaginal discharge she is no hip pain ankle pain foot pain swelling or color change to her leg she also is no thigh pain or calf pain there is no treatment prior to arrival other than the antibiotics that she took for the last few days she was supposed to take it early in the month. She wants to be treated with antibiotics in case she has exposure to gonorrhea and Chlamydia also wants HIV testing as well even though she states she tested negative for HIV about 6 months ago she prefers to get tested again. Past Medical History  Diagnosis Date  . Asthma   . Pneumonia    Past Surgical  History  Procedure Laterality Date  . Cesarean section    . Tubal ligation     History reviewed. No pertinent family history. History  Substance Use Topics  . Smoking status: Current Every Day Smoker -- 1.00 packs/day    Types: Cigarettes  . Smokeless tobacco: Not on file  . Alcohol Use: Yes   OB History   Grav Para Term Preterm Abortions TAB SAB Ect Mult Living                 Review of Systems 10 Systems reviewed and are negative for acute change except as noted in the HPI. Allergies  Diflucan; Percocet; and Tramadol  Home Medications   Current Outpatient Rx  Name  Route  Sig  Dispense  Refill  . ciprofloxacin (CIPRO) 500 MG tablet   Oral   Take 500 mg by mouth 2 (two) times daily.         . metroNIDAZOLE (FLAGYL) 500 MG tablet   Oral   Take 500 mg by mouth 3 (three) times daily.         . clotrimazole-betamethasone (LOTRISONE) cream      Apply to affected area 2 times daily prn   15 g   0   . hydrOXYzine (ATARAX/VISTARIL) 25 MG tablet   Oral   Take 1 tablet (25 mg total) by mouth every 6 (six) hours as needed for itching.   15 tablet  0   . traMADol (ULTRAM) 50 MG tablet   Oral   Take 1 tablet (50 mg total) by mouth every 6 (six) hours as needed for pain.   15 tablet   0   . traMADol (ULTRAM) 50 MG tablet   Oral   Take 1 tablet (50 mg total) by mouth every 6 (six) hours as needed for pain.   10 tablet   0    BP 116/70  Pulse 79  Temp(Src) 97.8 F (36.6 C) (Oral)  Resp 18  Ht 5\' 7"  (1.702 m)  Wt 167 lb (75.751 kg)  BMI 26.15 kg/m2  SpO2 100%  LMP 12/22/2012 Physical Exam  Nursing note and vitals reviewed. Constitutional:  Awake, alert, nontoxic appearance.  HENT:  Head: Atraumatic.  Eyes: Right eye exhibits no discharge. Left eye exhibits no discharge.  Neck: Neck supple.  Cardiovascular: Normal rate and regular rhythm.   No murmur heard. Pulmonary/Chest: Effort normal and breath sounds normal. No respiratory distress. She has no  wheezes. She has no rales. She exhibits no tenderness.  Abdominal: Soft. Bowel sounds are normal. She exhibits no distension and no mass. There is tenderness. There is no rebound and no guarding.  Minimal suprapubic tenderness with no rebound the rest of the abdomen is nontender and no CVA tenderness  Musculoskeletal: She exhibits tenderness. She exhibits no edema.  Baseline ROM, no obvious new focal weakness. Both arms and left leg nontender. Right leg is nontender at the hip thigh calf ankle and foot with dorsalis pedis pulse intact cap refill less than 2 seconds normal light touch good strength to the right leg the right knee exam has full active extension and full flexion with minimal anterior diffuse tenderness over the distal quadriceps quadriceps tendon patella patellar tendon with negative Lachman's negative McMurray's no pain or laxity with her survival stress testing no erythema swelling or deformity noted to the right knee  Neurological:  Mental status and motor strength appears baseline for patient and situation.  Skin: No rash noted.  Psychiatric: She has a normal mood and affect.    ED Course  Procedures (including critical care time) Chaperone present for pelvic examination scant white discharge no purulent discharge cervix without erythema no cervical motion tenderness no adnexal tenderness no palpable masses no external genital lesions noted Pt stable in ED with no significant deterioration in condition. Patient informed of clinical course, understand medical decision-making process, and agree with plan. Labs Reviewed  URINALYSIS, ROUTINE W REFLEX MICROSCOPIC - Abnormal; Notable for the following:    Leukocytes, UA TRACE (*)    All other components within normal limits  URINE MICROSCOPIC-ADD ON - Abnormal; Notable for the following:    Squamous Epithelial / LPF FEW (*)    Bacteria, UA FEW (*)    All other components within normal limits  GC/CHLAMYDIA PROBE AMP  PREGNANCY,  URINE  RPR  HIV ANTIBODY (ROUTINE TESTING)   No results found. 1. Pelvic pain   2. Patellofemoral syndrome, right     MDM  I doubt any other EMC precluding discharge at this time including, but not necessarily limited to the following:PID.  Hurman Horn, MD 01/10/13 2206

## 2013-01-10 NOTE — ED Notes (Signed)
MD at bedside. 

## 2013-01-10 NOTE — ED Notes (Signed)
Patient with c/o lower abdominal pain for about a week.  Patient denies any vaginal d/c or urinary symptoms.  Patient states that she has unprotected intercourse with someone and he has been with other people intimately.  Patient also c/o right knee pain which has been going on since she had kids.  Patient is intermittent and is 8/10.  Patient is able to ambulate w/o any difficulty.

## 2013-01-10 NOTE — ED Notes (Signed)
Pelvic cart is at the bedside set up and ready for the doctor to use. 

## 2013-01-11 LAB — HIV ANTIBODY (ROUTINE TESTING W REFLEX): HIV: NONREACTIVE

## 2013-01-27 ENCOUNTER — Inpatient Hospital Stay (HOSPITAL_COMMUNITY): Payer: Medicaid Other

## 2013-01-27 ENCOUNTER — Inpatient Hospital Stay (HOSPITAL_COMMUNITY)
Admission: AD | Admit: 2013-01-27 | Discharge: 2013-01-27 | Disposition: A | Discharge status: Home / Self Care | Payer: Medicaid Other | Source: Ambulatory Visit | Attending: Emergency Medicine | Admitting: Emergency Medicine

## 2013-01-27 ENCOUNTER — Encounter (HOSPITAL_COMMUNITY): Payer: Self-pay | Admitting: *Deleted

## 2013-01-27 DIAGNOSIS — R1032 Left lower quadrant pain: Secondary | ICD-10-CM | POA: Insufficient documentation

## 2013-01-27 DIAGNOSIS — B9689 Other specified bacterial agents as the cause of diseases classified elsewhere: Secondary | ICD-10-CM | POA: Insufficient documentation

## 2013-01-27 DIAGNOSIS — N760 Acute vaginitis: Secondary | ICD-10-CM | POA: Insufficient documentation

## 2013-01-27 DIAGNOSIS — A499 Bacterial infection, unspecified: Secondary | ICD-10-CM | POA: Insufficient documentation

## 2013-01-27 DIAGNOSIS — N920 Excessive and frequent menstruation with regular cycle: Secondary | ICD-10-CM | POA: Insufficient documentation

## 2013-01-27 DIAGNOSIS — R102 Pelvic and perineal pain: Secondary | ICD-10-CM

## 2013-01-27 DIAGNOSIS — D649 Anemia, unspecified: Secondary | ICD-10-CM | POA: Insufficient documentation

## 2013-01-27 HISTORY — DX: Anorexia: R63.0

## 2013-01-27 HISTORY — DX: Depression, unspecified: F32.A

## 2013-01-27 HISTORY — DX: Other fatigue: R53.83

## 2013-01-27 HISTORY — DX: Anemia, unspecified: D64.9

## 2013-01-27 HISTORY — DX: Anxiety disorder, unspecified: F41.9

## 2013-01-27 HISTORY — DX: Major depressive disorder, single episode, unspecified: F32.9

## 2013-01-27 LAB — COMPREHENSIVE METABOLIC PANEL
ALT: 8 U/L (ref 0–35)
CO2: 26 mEq/L (ref 19–32)
Calcium: 9.2 mg/dL (ref 8.4–10.5)
Creatinine, Ser: 0.76 mg/dL (ref 0.50–1.10)
GFR calc Af Amer: >90 mL/min (ref >90)
GFR calc non Af Amer: >90 mL/min (ref >90)
Glucose, Bld: 85 mg/dL (ref 70–99)
Sodium: 137 mEq/L (ref 135–145)
Total Protein: 7.5 g/dL (ref 6.0–8.3)

## 2013-01-27 LAB — URINALYSIS, ROUTINE W REFLEX MICROSCOPIC
Bilirubin Urine: NEGATIVE
Ketones, ur: 15 mg/dL — AB
Leukocytes, UA: NEGATIVE
Nitrite: NEGATIVE
Protein, ur: NEGATIVE mg/dL

## 2013-01-27 LAB — CBC
Hemoglobin: 9.9 g/dL — ABNORMAL LOW (ref 12.0–15.0)
MCH: 24.1 pg — ABNORMAL LOW (ref 26.0–34.0)
MCHC: 32 g/dL (ref 30.0–36.0)
MCV: 75.4 fL — ABNORMAL LOW (ref 78.0–100.0)
Platelets: 346 10*3/uL (ref 150–400)
RBC: 4.1 MIL/uL (ref 3.87–5.11)

## 2013-01-27 MED ORDER — IOHEXOL 300 MG/ML  SOLN
100.0000 mL | Freq: Once | INTRAMUSCULAR | Status: AC | PRN
  Administered 2013-01-27: 100 mL via INTRAVENOUS

## 2013-01-27 MED ORDER — FERROUS SULFATE 325 (65 FE) MG PO TABS: 325.0000 mg | ORAL_TABLET | Freq: Every day | ORAL | Status: DC

## 2013-01-27 MED ORDER — SODIUM CHLORIDE 0.9 % IV SOLN
INTRAVENOUS | Status: DC
  Administered 2013-01-27 (×2): via INTRAVENOUS

## 2013-01-27 MED ORDER — KETOROLAC TROMETHAMINE 60 MG/2ML IM SOLN
60.0000 mg | Freq: Once | INTRAMUSCULAR | Status: AC
  Administered 2013-01-27: 60 mg via INTRAMUSCULAR
  Filled 2013-01-27: qty 2

## 2013-01-27 MED ORDER — IOHEXOL 300 MG/ML  SOLN
50.0000 mL | Freq: Once | INTRAMUSCULAR | Status: AC | PRN
  Administered 2013-01-27: 50 mL via ORAL

## 2013-01-27 MED ORDER — METRONIDAZOLE 0.75 % VA GEL: 1.0000 | Freq: Two times a day (BID) | VAGINAL | Status: DC

## 2013-01-27 MED ORDER — HYDROCODONE-ACETAMINOPHEN 5-325 MG PO TABS: 2.0000 | ORAL_TABLET | ORAL | Status: DC | PRN

## 2013-01-27 MED ORDER — MORPHINE SULFATE 4 MG/ML IJ SOLN
4.0000 mg | Freq: Once | INTRAMUSCULAR | Status: AC
  Administered 2013-01-27: 4 mg via INTRAVENOUS
  Filled 2013-01-27: qty 1

## 2013-01-27 MED ORDER — FAMOTIDINE 20 MG PO TABS
20.0000 mg | ORAL_TABLET | Freq: Once | ORAL | Status: AC
  Administered 2013-01-27: 20 mg via ORAL
  Filled 2013-01-27: qty 1

## 2013-01-27 NOTE — MAU Note (Signed)
Patient states she has been going to her MD and [facility] for a couple of weeks with generalized body aches, abdominal pain all over the abdomen and bilateral knee pain. States all testing negative but she continues to have all the above symptoms. Denies bleeding or vaginal discharge.

## 2013-01-27 NOTE — ED Notes (Signed)
[id number]> Expected date:<BR> Expected time:<BR> Means of arrival:<BR> Comments:<BR> MAU transfer

## 2013-01-27 NOTE — MAU Provider Note (Signed)
History     CSN: [id number]  Arrival date and time: 01/27/13 1122   First Provider Initiated Contact with Patient 01/27/13 1348      Chief Complaint  Patient presents with  . Abdominal Pain  . Knee Pain  . Nausea  . Generalized Body Aches   HPI [name] is a 27 y.o. [id number] who presents to MAU today with LLQ and epigastric pain x weeks. The patient was seen at [facility] and [facility] recently for similar problems. She states that she has intermittent diarrhea and constipation. She has had some nausea without vomiting. She denies fever, vaginal discharge or irregular bleeding. She was diagnosed with BV recently and did not complete the flagyl because she "is bad with taking pills." She has had a BTL. She states that periods are regular, but heavy with clots. LMP was 01/17/13.   OB History   Grav Para Term Preterm Abortions TAB SAB Ect Mult Living   6 5 5  1  1  1 5       Past Medical History  Diagnosis Date  . Asthma   . Pneumonia   . Fatigue   . Anemia   . Anxiety   . Depression     No meds. currently. Pt. stopped taking  . Loss of appetite     Past Surgical History  Procedure Laterality Date  . Cesarean section    . Tubal ligation      History reviewed. No pertinent family history.  History  Substance Use Topics  . Smoking status: Current Every Day Smoker -- 1.00 packs/day    Types: Cigarettes  . Smokeless tobacco: Never Used  . Alcohol Use: 8.4 oz/week    14 Cans of beer per week     Comment: 2 (40oz) bottles per day    Allergies:  Allergies  Allergen Reactions  . Diflucan (Fluconazole) Nausea And Vomiting  . Percocet (Oxycodone-Acetaminophen) Nausea And Vomiting  . Tramadol Itching and Nausea And Vomiting    Prescriptions prior to admission  Medication Sig Dispense Refill  . clotrimazole-betamethasone (LOTRISONE) cream Apply to affected area 2 times daily prn  15 g  0  . metroNIDAZOLE (FLAGYL) 500 MG tablet Take 500 mg by mouth 3 (three)  times daily.        Review of Systems  Constitutional: Positive for malaise/fatigue. Negative for fever.  Gastrointestinal: Positive for nausea, abdominal pain, diarrhea and constipation. Negative for vomiting.  Genitourinary: Negative for dysuria, urgency and frequency.       Neg - vaginal bleeding, discharge  Neurological: Positive for weakness. Negative for dizziness and loss of consciousness.   Physical Exam   Blood pressure 112/72, pulse 78, temperature 98.6 F (37 C), temperature source Oral, resp. rate 16, height 5\' 8"  (1.727 m), weight 170 lb (77.111 kg), last menstrual period 01/17/2013.  Physical Exam  Constitutional: She appears well-developed and well-nourished. No distress.  HENT:  Head: Normocephalic and atraumatic.  Cardiovascular: Normal rate, regular rhythm and normal heart sounds.   Respiratory: Effort normal and breath sounds normal. No respiratory distress.  GI: Soft. Bowel sounds are normal. She exhibits no distension and no mass. There is tenderness (moderate to severe tenderness to palpation of the LLQ). There is no rebound and no guarding.  Neurological: She is alert.  Skin: Skin is warm and dry. No erythema.  Psychiatric: She has a normal mood and affect.   Results for orders placed during the hospital encounter of 01/27/13 (from the past 24 hour(s))  URINALYSIS, ROUTINE W REFLEX MICROSCOPIC     Status: Abnormal   Collection Time    01/27/13 11:45 AM      Result Value Range   Color, Urine YELLOW  YELLOW   APPearance CLEAR  CLEAR   Specific Gravity, Urine 1.020  1.005 - 1.030   pH 8.0  5.0 - 8.0   Glucose, UA NEGATIVE  NEGATIVE mg/dL   Hgb urine dipstick NEGATIVE  NEGATIVE   Bilirubin Urine NEGATIVE  NEGATIVE   Ketones, ur 15 (*) NEGATIVE mg/dL   Protein, ur NEGATIVE  NEGATIVE mg/dL   Urobilinogen, UA 1.0  0.0 - 1.0 mg/dL   Nitrite NEGATIVE  NEGATIVE   Leukocytes, UA NEGATIVE  NEGATIVE  POCT PREGNANCY, URINE     Status: None   Collection Time     01/27/13 11:53 AM      Result Value Range   Preg Test, Ur NEGATIVE  NEGATIVE  CBC     Status: Abnormal   Collection Time    01/27/13  2:10 PM      Result Value Range   WBC 5.6  4.0 - 10.5 K/uL   RBC 4.10  3.87 - 5.11 MIL/uL   Hemoglobin 9.9 (*) 12.0 - 15.0 g/dL   HCT 16.1 (*) 09.6 - 04.5 %   MCV 75.4 (*) 78.0 - 100.0 fL   MCH 24.1 (*) 26.0 - 34.0 pg   MCHC 32.0  30.0 - 36.0 g/dL   RDW 40.9 (*) 81.1 - 91.4 %   Platelets 346  150 - 400 K/uL  COMPREHENSIVE METABOLIC PANEL     Status: None   Collection Time    01/27/13  2:10 PM      Result Value Range   Sodium 137  135 - 145 mEq/L   Potassium 4.1  3.5 - 5.1 mEq/L   Chloride 103  96 - 112 mEq/L   CO2 26  19 - 32 mEq/L   Glucose, Bld 85  70 - 99 mg/dL   BUN 10  6 - 23 mg/dL   Creatinine, Ser 7.82  0.50 - 1.10 mg/dL   Calcium 9.2  8.4 - 95.6 mg/dL   Total Protein 7.5  6.0 - 8.3 g/dL   Albumin 3.6  3.5 - 5.2 g/dL   AST 17  0 - 37 U/L   ALT 8  0 - 35 U/L   Alkaline Phosphatase 56  39 - 117 U/L   Total Bilirubin 0.3  0.3 - 1.2 mg/dL   GFR calc non Af Amer >90  >90 mL/min   GFR calc Af Amer >90  >90 mL/min    MAU Course  Procedures None  MDM Negative UPT Source of pain appears to be GI related. Will send to [facility] for further evaluation and possible GI consultation.  Discussed patient with Dr. Bonney Roussel. Ok for transfer 60 mg Toradol given IM and 20 mg Pepcid PO  Assessment and Plan  A: LLQ pain most likely GI origin Menorrhagia Anemia Bacterial vaginosis, previously diagnosed and inadequately treated  P: Transfer patient to [facility] for further evaluation and management of LLQ pain Rx for metrogel and ferrous sulfate sent to patient's pharmacy Patient referred to [facility] clinic for further management of menorrhagia Patient may return to MAU or ED as needed  Freddi Starr, PA-C  01/27/2013, 3:02 PM

## 2013-01-27 NOTE — Progress Notes (Signed)
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 States MD "squeezed" it about 2 wks ago (around 7/3)and it started draining. He gave her meds. States she did not complete meds.  States cyst is still there, but not as big. States she thought meds. were effecting her stomach and bowels. States she has frequent BV, but no STD's. States she drinks beer, 2 (40oz) bottles every day.

## 2013-01-27 NOTE — ED Notes (Signed)
Pt BIB [facility] from MAU at [facility]. Pt has had a hx of PID. Pt has also had one month of vaginal discharge. Pt has been on Flagyl and Cipro for same. Pt was given Toradol 60mg  and Pepcid 20mg  PTA. Pt c/o slight nausea. Pt a/o x 4. Skin warm, dry.

## 2013-01-27 NOTE — MAU Note (Signed)
Pt not in room or either bathroom, did not inform staff she was leaving. Provider notified.

## 2013-01-27 NOTE — ED Notes (Signed)
Patient transported to CT 

## 2013-01-27 NOTE — ED Notes (Signed)
MD at bedside. Dr. Allen at bedside.  

## 2013-01-27 NOTE — ED Provider Notes (Signed)
History    CSN: [id number] Arrival date & time 01/27/13  1122  First MD Initiated Contact with Patient 01/27/13 1730     Chief Complaint  Patient presents with  . Abdominal Pain   (Consider location/radiation/quality/duration/timing/severity/associated sxs/prior Treatment) Patient is a 27 y.o. female presenting with abdominal pain. The history is provided by the patient.  Abdominal Pain This is a new problem. The current episode started more than 1 week ago. The problem occurs constantly. The problem has not changed since onset.Associated symptoms include abdominal pain. The symptoms are aggravated by walking. Nothing relieves the symptoms. She has tried nothing for the symptoms.  pt here with 3 weeks of left lower quadrant and epigastric pain. No hematuria or dysuria. No vaginal bleeding or discharge. No fever or chills. Went to [facility] prior to arrival and had an evaluation there which was reviewed here. Patient sent here for further management Past Medical History  Diagnosis Date  . Asthma   . Pneumonia   . Fatigue   . Anemia   . Anxiety   . Depression     No meds. currently. Pt. stopped taking  . Loss of appetite    Past Surgical History  Procedure Laterality Date  . Cesarean section    . Tubal ligation     History reviewed. No pertinent family history. History  Substance Use Topics  . Smoking status: Current Every Day Smoker -- 1.00 packs/day    Types: Cigarettes  . Smokeless tobacco: Never Used  . Alcohol Use: 8.4 oz/week    14 Cans of beer per week     Comment: 2 (40oz) bottles per day   OB History   Grav Para Term Preterm Abortions TAB SAB Ect Mult Living   6 5 5  1  1  1 5      Review of Systems  Gastrointestinal: Positive for abdominal pain.  All other systems reviewed and are negative.    Allergies  Diflucan; Percocet; and Tramadol  Home Medications   Current Outpatient Rx  Name  Route  Sig  Dispense  Refill  .  clotrimazole-betamethasone (LOTRISONE) cream      Apply to affected area 2 times daily prn   15 g   0   . metroNIDAZOLE (FLAGYL) 500 MG tablet   Oral   Take 500 mg by mouth 3 (three) times daily.         . ferrous sulfate 325 (65 FE) MG tablet   Oral   Take 1 tablet (325 mg total) by mouth daily.   30 tablet   0   . metroNIDAZOLE (METROGEL VAGINAL) 0.75 % vaginal gel   Vaginal   Place 1 Applicatorful vaginally 2 (two) times daily.   70 g   0    BP 106/55  Pulse 78  Temp(Src) 98.7 F (37.1 C) (Oral)  Resp 16  Ht 5\' 8"  (1.727 m)  Wt 170 lb (77.111 kg)  BMI 25.85 kg/m2  SpO2 100%  LMP 01/17/2013 Physical Exam  Nursing note and vitals reviewed. Constitutional: She is oriented to person, place, and time. She appears well-developed and well-nourished.  Non-toxic appearance. No distress.  HENT:  Head: Normocephalic and atraumatic.  Eyes: Conjunctivae, EOM and lids are normal. Pupils are equal, round, and reactive to light.  Neck: Normal range of motion. Neck supple. No tracheal deviation present. No mass present.  Cardiovascular: Normal rate, regular rhythm and normal heart sounds.  Exam reveals no gallop.   No murmur heard. Pulmonary/Chest:  Effort normal and breath sounds normal. No stridor. No respiratory distress. She has no decreased breath sounds. She has no wheezes. She has no rhonchi. She has no rales.  Abdominal: Soft. Normal appearance and bowel sounds are normal. She exhibits no distension. There is tenderness in the left upper quadrant and left lower quadrant. There is no rigidity, no rebound, no guarding and no CVA tenderness.    Musculoskeletal: Normal range of motion. She exhibits no edema and no tenderness.  Neurological: She is alert and oriented to person, place, and time. She has normal strength. No cranial nerve deficit or sensory deficit. GCS eye subscore is 4. GCS verbal subscore is 5. GCS motor subscore is 6.  Skin: Skin is warm and dry. No abrasion  and no rash noted.  Psychiatric: She has a normal mood and affect. Her speech is normal and behavior is normal.    ED Course  Procedures (including critical care time) Labs Reviewed  URINALYSIS, ROUTINE W REFLEX MICROSCOPIC - Abnormal; Notable for the following:    Ketones, ur 15 (*)    All other components within normal limits  CBC - Abnormal; Notable for the following:    Hemoglobin 9.9 (*)    HCT 30.9 (*)    MCV 75.4 (*)    MCH 24.1 (*)    RDW 19.3 (*)    All other components within normal limits  COMPREHENSIVE METABOLIC PANEL  POCT PREGNANCY, URINE   No results found. 1. Abdominal pain, left lower quadrant     MDM  Patient had abdominal CT which showed any acute intra-abdominal process. Patient instructed to call for Dr. as needed  Toy Baker, MD 01/27/13 2128

## 2013-03-11 ENCOUNTER — Encounter: Payer: Medicaid Other | Admitting: Obstetrics & Gynecology

## 2013-04-24 ENCOUNTER — Encounter (HOSPITAL_BASED_OUTPATIENT_CLINIC_OR_DEPARTMENT_OTHER): Payer: Self-pay | Admitting: Emergency Medicine

## 2013-04-24 ENCOUNTER — Emergency Department (HOSPITAL_BASED_OUTPATIENT_CLINIC_OR_DEPARTMENT_OTHER)
Admission: EM | Admit: 2013-04-24 | Discharge: 2013-04-24 | Disposition: A | Discharge status: Home / Self Care | Payer: Medicaid Other | Attending: Emergency Medicine | Admitting: Emergency Medicine

## 2013-04-24 DIAGNOSIS — A499 Bacterial infection, unspecified: Secondary | ICD-10-CM | POA: Insufficient documentation

## 2013-04-24 DIAGNOSIS — Z8701 Personal history of pneumonia (recurrent): Secondary | ICD-10-CM | POA: Insufficient documentation

## 2013-04-24 DIAGNOSIS — T85848A Pain due to other internal prosthetic devices, implants and grafts, initial encounter: Secondary | ICD-10-CM

## 2013-04-24 DIAGNOSIS — K089 Disorder of teeth and supporting structures, unspecified: Secondary | ICD-10-CM | POA: Insufficient documentation

## 2013-04-24 DIAGNOSIS — F17200 Nicotine dependence, unspecified, uncomplicated: Secondary | ICD-10-CM | POA: Insufficient documentation

## 2013-04-24 DIAGNOSIS — N760 Acute vaginitis: Secondary | ICD-10-CM | POA: Insufficient documentation

## 2013-04-24 DIAGNOSIS — G8918 Other acute postprocedural pain: Secondary | ICD-10-CM | POA: Insufficient documentation

## 2013-04-24 DIAGNOSIS — Z3202 Encounter for pregnancy test, result negative: Secondary | ICD-10-CM | POA: Insufficient documentation

## 2013-04-24 DIAGNOSIS — B3731 Acute candidiasis of vulva and vagina: Secondary | ICD-10-CM | POA: Insufficient documentation

## 2013-04-24 DIAGNOSIS — B9689 Other specified bacterial agents as the cause of diseases classified elsewhere: Secondary | ICD-10-CM | POA: Insufficient documentation

## 2013-04-24 DIAGNOSIS — Z862 Personal history of diseases of the blood and blood-forming organs and certain disorders involving the immune mechanism: Secondary | ICD-10-CM | POA: Insufficient documentation

## 2013-04-24 DIAGNOSIS — R51 Headache: Secondary | ICD-10-CM | POA: Insufficient documentation

## 2013-04-24 DIAGNOSIS — J45909 Unspecified asthma, uncomplicated: Secondary | ICD-10-CM | POA: Insufficient documentation

## 2013-04-24 DIAGNOSIS — M542 Cervicalgia: Secondary | ICD-10-CM | POA: Insufficient documentation

## 2013-04-24 DIAGNOSIS — B373 Candidiasis of vulva and vagina: Secondary | ICD-10-CM

## 2013-04-24 DIAGNOSIS — Z8659 Personal history of other mental and behavioral disorders: Secondary | ICD-10-CM | POA: Insufficient documentation

## 2013-04-24 LAB — URINALYSIS, ROUTINE W REFLEX MICROSCOPIC
Bilirubin Urine: NEGATIVE
Glucose, UA: NEGATIVE mg/dL
Ketones, ur: NEGATIVE mg/dL
Nitrite: NEGATIVE
Protein, ur: NEGATIVE mg/dL
pH: 6 (ref 5.0–8.0)

## 2013-04-24 LAB — WET PREP, GENITAL: Trich, Wet Prep: NONE SEEN

## 2013-04-24 LAB — URINE MICROSCOPIC-ADD ON

## 2013-04-24 MED ORDER — TERCONAZOLE 0.8 % VA CREA: 1.0000 | TOPICAL_CREAM | Freq: Every day | VAGINAL | Status: DC

## 2013-04-24 MED ORDER — HYDROCODONE-ACETAMINOPHEN 5-325 MG PO TABS: 2.0000 | ORAL_TABLET | ORAL | Status: DC | PRN

## 2013-04-24 MED ORDER — METRONIDAZOLE 500 MG PO TABS: 500.0000 mg | ORAL_TABLET | Freq: Two times a day (BID) | ORAL | Status: DC

## 2013-04-24 NOTE — ED Provider Notes (Signed)
CSN: [id number]     Arrival date & time 04/24/13  1419 History   This chart was scribed for Geoffery Lyons, MD by Ronal Fear, ED Scribe. This patient was seen in room MHT13/MHT13 and the patient's care was started at 3:39 PM.    Chief Complaint  Patient presents with  . Dental Pain    Patient is a 27 y.o. female presenting with tooth pain. The history is provided by the patient. No language interpreter was used.  Dental Pain Location:  Lower Quality:  Dull Severity:  Mild Onset quality:  Gradual Duration:  2 days Timing:  Intermittent Progression:  Worsening Chronicity:  New Context: abscess   Context: normal dentition   Relieved by:  Nothing Worsened by:  Touching Associated symptoms: headaches and neck pain   Associated symptoms: no congestion, no facial pain, no facial swelling and no fever     Pt is having HA's located at the top of her head.  She states that she may have an abscess in the right upper jaw. Pt was taking amoxicillin 500 mg for 1.5 wks ago with no relief. Pt does not appear to be in any acute distress but she is complaining of vaginal discomfort and itching without discharge.   Past Medical History  Diagnosis Date  . Asthma   . Pneumonia   . Fatigue   . Anemia   . Anxiety   . Depression     No meds. currently. Pt. stopped taking  . Loss of appetite    Past Surgical History  Procedure Laterality Date  . Cesarean section    . Tubal ligation     No family history on file. History  Substance Use Topics  . Smoking status: Current Every Day Smoker -- 1.00 packs/day    Types: Cigarettes  . Smokeless tobacco: Never Used  . Alcohol Use: 8.4 oz/week    14 Cans of beer per week     Comment: 2 (40oz) bottles per day   OB History   Grav Para Term Preterm Abortions TAB SAB Ect Mult Living   6 5 5  1  1  1 5      Review of Systems  Constitutional: Negative for fever.  HENT: Negative for congestion and facial swelling.   Genitourinary: Negative for  dysuria and vaginal discharge.  Musculoskeletal: Positive for neck pain.  Neurological: Positive for headaches.  All other systems reviewed and are negative.    Allergies  Diflucan; Percocet; and Tramadol  Home Medications  No current outpatient prescriptions on file. BP 127/76  Pulse 84  Temp(Src) 99.1 F (37.3 C) (Oral)  Resp 18  SpO2 100% Physical Exam  Nursing note and vitals reviewed. Constitutional: She is oriented to person, place, and time. She appears well-developed and well-nourished. No distress.  HENT:  Head: Normocephalic and atraumatic.  Mouth/Throat: Oropharynx is clear and moist.  right upper first molar is missing. With empty socket. There is mild gingival irritation but no prurelent drainage or obvious abscess.  Eyes: EOM are normal.  Neck: Neck supple. No tracheal deviation present.  Cardiovascular: Normal rate.   Pulmonary/Chest: Effort normal. No respiratory distress.  Musculoskeletal: Normal range of motion.  Neurological: She is alert and oriented to person, place, and time.  Skin: Skin is warm and dry.  Psychiatric: She has a normal mood and affect. Her behavior is normal.    ED Course  Procedures (including critical care time) DIAGNOSTIC STUDIES: Oxygen Saturation is 100% on RA, normal by my interpretation.  COORDINATION OF CARE: 3:43 PM- Pt advised of plan for treatment including UA, pain medication and a dental referral and pt agrees.     Labs Review Labs Reviewed - No data to display Imaging Review No results found.  EKG Interpretation   None       MDM  No diagnosis found. Patient presents with complaints of dental pain. Exam reveals an area where the tooth is missing however I see no obvious abscess or other abnormality. She also has complaints of vaginal discharge for which pelvic exam was performed. This revealed a thick white discharge which wet prep reveals is consistent with a yeast infection. We'll treat with Diflucan and  Flagyl as they're also clue cells present. She is to return or followup as needed if not improving.  I personally performed the services described in this documentation, which was scribed in my presence. The recorded information has been reviewed and is accurate.      Geoffery Lyons, MD 04/24/13 1719

## 2013-04-24 NOTE — ED Notes (Signed)
Patient here with right upper dental pain x 2 days, reports that she feels knot in gum, took antibiotics for a different abscess with minimal relief. Also here for headache and now reports possible yeast infection from previous antibiotic

## 2013-04-25 LAB — GC/CHLAMYDIA PROBE AMP
CT Probe RNA: NEGATIVE
GC Probe RNA: NEGATIVE

## 2014-05-16 ENCOUNTER — Encounter (HOSPITAL_BASED_OUTPATIENT_CLINIC_OR_DEPARTMENT_OTHER): Payer: Self-pay | Admitting: Emergency Medicine

## 2014-11-12 ENCOUNTER — Encounter (HOSPITAL_BASED_OUTPATIENT_CLINIC_OR_DEPARTMENT_OTHER): Payer: Self-pay | Admitting: Emergency Medicine

## 2014-11-12 ENCOUNTER — Emergency Department (HOSPITAL_BASED_OUTPATIENT_CLINIC_OR_DEPARTMENT_OTHER)
Admission: EM | Admit: 2014-11-12 | Discharge: 2014-11-12 | Disposition: A | Discharge status: Home / Self Care | Payer: Medicaid Other | Attending: Emergency Medicine | Admitting: Emergency Medicine

## 2014-11-12 DIAGNOSIS — Z9851 Tubal ligation status: Secondary | ICD-10-CM | POA: Diagnosis not present

## 2014-11-12 DIAGNOSIS — Z8659 Personal history of other mental and behavioral disorders: Secondary | ICD-10-CM | POA: Diagnosis not present

## 2014-11-12 DIAGNOSIS — Z792 Long term (current) use of antibiotics: Secondary | ICD-10-CM | POA: Insufficient documentation

## 2014-11-12 DIAGNOSIS — R1013 Epigastric pain: Secondary | ICD-10-CM | POA: Insufficient documentation

## 2014-11-12 DIAGNOSIS — Z3202 Encounter for pregnancy test, result negative: Secondary | ICD-10-CM | POA: Insufficient documentation

## 2014-11-12 DIAGNOSIS — D509 Iron deficiency anemia, unspecified: Secondary | ICD-10-CM | POA: Diagnosis not present

## 2014-11-12 DIAGNOSIS — R197 Diarrhea, unspecified: Secondary | ICD-10-CM | POA: Diagnosis not present

## 2014-11-12 DIAGNOSIS — Z9889 Other specified postprocedural states: Secondary | ICD-10-CM | POA: Insufficient documentation

## 2014-11-12 DIAGNOSIS — Z8701 Personal history of pneumonia (recurrent): Secondary | ICD-10-CM | POA: Diagnosis not present

## 2014-11-12 DIAGNOSIS — Z720 Tobacco use: Secondary | ICD-10-CM | POA: Insufficient documentation

## 2014-11-12 DIAGNOSIS — R110 Nausea: Secondary | ICD-10-CM | POA: Diagnosis not present

## 2014-11-12 DIAGNOSIS — J45909 Unspecified asthma, uncomplicated: Secondary | ICD-10-CM | POA: Insufficient documentation

## 2014-11-12 LAB — URINALYSIS, ROUTINE W REFLEX MICROSCOPIC
BILIRUBIN URINE: NEGATIVE
GLUCOSE, UA: NEGATIVE mg/dL
HGB URINE DIPSTICK: NEGATIVE
KETONES UR: NEGATIVE mg/dL
Leukocytes, UA: NEGATIVE
Nitrite: NEGATIVE
PH: 5.5 (ref 5.0–8.0)
Protein, ur: NEGATIVE mg/dL
Specific Gravity, Urine: 1.007 (ref 1.005–1.030)
Urobilinogen, UA: 0.2 mg/dL (ref 0.0–1.0)

## 2014-11-12 LAB — CBC WITH DIFFERENTIAL/PLATELET
BASOS PCT: 0 % (ref 0–1)
Basophils Absolute: 0 10*3/uL (ref 0.0–0.1)
EOS PCT: 2 % (ref 0–5)
Eosinophils Absolute: 0.1 10*3/uL (ref 0.0–0.7)
HCT: 24.1 % — ABNORMAL LOW (ref 36.0–46.0)
HEMOGLOBIN: 7.4 g/dL — AB (ref 12.0–15.0)
Lymphocytes Relative: 29 % (ref 12–46)
Lymphs Abs: 1.7 10*3/uL (ref 0.7–4.0)
MCH: 19.9 pg — AB (ref 26.0–34.0)
MCHC: 30.7 g/dL (ref 30.0–36.0)
MCV: 65 fL — ABNORMAL LOW (ref 78.0–100.0)
MONO ABS: 0.6 10*3/uL (ref 0.1–1.0)
Monocytes Relative: 11 % (ref 3–12)
Neutro Abs: 3.3 10*3/uL (ref 1.7–7.7)
Neutrophils Relative %: 58 % (ref 43–77)
PLATELETS: 453 10*3/uL — AB (ref 150–400)
RBC: 3.71 MIL/uL — AB (ref 3.87–5.11)
RDW: 18.1 % — ABNORMAL HIGH (ref 11.5–15.5)
WBC: 5.7 10*3/uL (ref 4.0–10.5)

## 2014-11-12 LAB — COMPREHENSIVE METABOLIC PANEL
ALT: 14 U/L (ref 0–35)
ANION GAP: 4 — AB (ref 5–15)
AST: 18 U/L (ref 0–37)
Albumin: 3.7 g/dL (ref 3.5–5.2)
Alkaline Phosphatase: 73 U/L (ref 39–117)
BUN: 8 mg/dL (ref 6–23)
CALCIUM: 8.5 mg/dL (ref 8.4–10.5)
CO2: 25 mmol/L (ref 19–32)
CREATININE: 0.63 mg/dL (ref 0.50–1.10)
Chloride: 107 mmol/L (ref 96–112)
GFR calc non Af Amer: >90 mL/min (ref >90)
Glucose, Bld: 95 mg/dL (ref 70–99)
Potassium: 3.7 mmol/L (ref 3.5–5.1)
Sodium: 136 mmol/L (ref 135–145)
Total Bilirubin: 0.3 mg/dL (ref 0.3–1.2)
Total Protein: 7.7 g/dL (ref 6.0–8.3)

## 2014-11-12 LAB — OCCULT BLOOD X 1 CARD TO LAB, STOOL: FECAL OCCULT BLD: NEGATIVE

## 2014-11-12 LAB — PREGNANCY, URINE: Preg Test, Ur: NEGATIVE

## 2014-11-12 LAB — LIPASE, BLOOD: LIPASE: 30 U/L (ref 11–59)

## 2014-11-12 MED ORDER — MORPHINE SULFATE 4 MG/ML IJ SOLN
4.0000 mg | Freq: Once | INTRAMUSCULAR | Status: AC
  Administered 2014-11-12: 4 mg via INTRAVENOUS
  Filled 2014-11-12: qty 1

## 2014-11-12 MED ORDER — HYDROCODONE-ACETAMINOPHEN 5-325 MG PO TABS
1.0000 | ORAL_TABLET | Freq: Once | ORAL | Status: AC
  Administered 2014-11-12: 1 via ORAL
  Filled 2014-11-12: qty 1

## 2014-11-12 MED ORDER — SUCRALFATE 1 G PO TABS: 1.0000 g | ORAL_TABLET | Freq: Three times a day (TID) | ORAL | Status: DC

## 2014-11-12 MED ORDER — OMEPRAZOLE 20 MG PO CPDR: DELAYED_RELEASE_CAPSULE | ORAL | Status: DC

## 2014-11-12 MED ORDER — GI COCKTAIL ~~LOC~~
30.0000 mL | Freq: Once | ORAL | Status: AC
  Administered 2014-11-12: 30 mL via ORAL
  Filled 2014-11-12: qty 30

## 2014-11-12 MED ORDER — HYDROCODONE-ACETAMINOPHEN 5-325 MG PO TABS: ORAL_TABLET | ORAL | Status: DC

## 2014-11-12 MED ORDER — FERROUS SULFATE 325 (65 FE) MG PO TABS: 325.0000 mg | ORAL_TABLET | Freq: Two times a day (BID) | ORAL | Status: DC

## 2014-11-12 MED ORDER — ONDANSETRON HCL 4 MG/2ML IJ SOLN
4.0000 mg | Freq: Once | INTRAMUSCULAR | Status: AC
  Administered 2014-11-12: 4 mg via INTRAVENOUS
  Filled 2014-11-12: qty 2

## 2014-11-12 NOTE — Discharge Instructions (Signed)
Please read and follow all provided instructions.  Your diagnoses today include:  1. Epigastric pain   2. Iron deficiency anemia     Tests performed today include:  Blood counts and electrolytes - shows low red blood cell counts  Blood tests to check liver and kidney function  Blood tests to check pancreas function  Urine test to look for infection and pregnancy (in women)  Vital signs. See below for your results today.   Medications prescribed:   Vicodin (hydrocodone/acetaminophen) - narcotic pain medication  DO NOT drive or perform any activities that require you to be awake and alert because this medicine can make you drowsy. BE VERY CAREFUL not to take multiple medicines containing Tylenol (also called acetaminophen). Doing so can lead to an overdose which can damage your liver and cause liver failure and possibly death.   Omeprazole (Prilosec) - stomach acid reducer  This medication can be found over-the-counter   Carafate - for stomach upset and to protect your stomach  Take any prescribed medications only as directed.  Home care instructions:   Follow any educational materials contained in this packet.  Follow-up instructions: Please follow-up with your primary care provider in the next 3 days for further evaluation of your symptoms.    Please see your GYN doctor for evaluation of your heavy periods -- which may be contributing to your low red blood cell counts.   Please follow-up with the stomach doctor listed for evaluation of your abdominal pain.   Return instructions:  SEEK IMMEDIATE MEDICAL ATTENTION IF:  The pain does not go away or becomes severe   A temperature above 101F develops   Repeated vomiting occurs (multiple episodes)   The pain becomes localized to portions of the abdomen. The right side could possibly be appendicitis. In an adult, the left lower portion of the abdomen could be colitis or diverticulitis.   Blood is being passed in  stools or vomit (bright red or black tarry stools)   You develop chest pain, difficulty breathing, dizziness or fainting, or become confused, poorly responsive, or inconsolable (young children)  If you have any other emergent concerns regarding your health  Additional Information: Abdominal (belly) pain can be caused by many things. Your caregiver performed an examination and possibly ordered blood/urine tests and imaging (CT scan, x-rays, ultrasound). Many cases can be observed and treated at home after initial evaluation in the emergency department. Even though you are being discharged home, abdominal pain can be unpredictable. Therefore, you need a repeated exam if your pain does not resolve, returns, or worsens. Most patients with abdominal pain don't have to be admitted to the hospital or have surgery, but serious problems like appendicitis and gallbladder attacks can start out as nonspecific pain. Many abdominal conditions cannot be diagnosed in one visit, so follow-up evaluations are very important.  Your vital signs today were: BP 115/76 mmHg   Pulse 62   Temp(Src) 98.5 F (36.9 C)   Resp 16   Ht 5\' 7"  (1.702 m)   Wt 180 lb 1 oz (81.676 kg)   BMI 28.20 kg/m2   SpO2 100%   LMP 11/02/2014 If your blood pressure (bp) was elevated above 135/85 this visit, please have this repeated by your doctor within one month. --------------

## 2014-11-12 NOTE — ED Notes (Signed)
29 yo with abd pain reports diarrhea and vomiting since Thursday. Hx of abdominal pain for months. Symptoms worsen when she eats.

## 2014-11-12 NOTE — ED Notes (Signed)
PA-C in to see pt

## 2014-11-12 NOTE — ED Notes (Signed)
Hemocult collected by PA-C

## 2014-11-12 NOTE — ED Notes (Signed)
Significant other here to drive pt home

## 2014-11-12 NOTE — ED Provider Notes (Signed)
CSN: [id number]     Arrival date & time 11/12/14  1415 History   First MD Initiated Contact with Patient 11/12/14 1425     Chief Complaint  Patient presents with  . Abdominal Pain     (Consider location/radiation/quality/duration/timing/severity/associated sxs/prior Treatment) HPI Comments: Patients with history of daily alcohol use, chronic abdominal pain -- presents with complaint of epigastric pain. Patient states that she has had this intermittently for approximately one year. It is been worse over the past 3-4 days. It has been associated with nausea but no vomiting, diarrhea without blood. Patient denies heavy NSAID use. She denies recent travel or antibiotic use. Pain is worse with eating. It does not radiate to her back. She has used Pepcid and Zantac in the past without relief. She is not on a PPI currently.The onset of this condition was acute. The course is constant. Aggravating factors: none. Alleviating factors: none. No history of abdominal surgeries except for C-section.   The history is provided by the patient.    Past Medical History  Diagnosis Date  . Asthma   . Pneumonia   . Fatigue   . Anemia   . Anxiety   . Depression     No meds. currently. Pt. stopped taking  . Loss of appetite    Past Surgical History  Procedure Laterality Date  . Cesarean section    . Tubal ligation     No family history on file. History  Substance Use Topics  . Smoking status: Current Every Day Smoker -- 1.00 packs/day    Types: Cigarettes  . Smokeless tobacco: Never Used  . Alcohol Use: 8.4 oz/week    14 Cans of beer per week     Comment: 2 (40oz) bottles per day   OB History    Gravida Para Term Preterm AB TAB SAB Ectopic Multiple Living   6 5 5  1  1  1 5      Review of Systems  Constitutional: Negative for fever.  HENT: Negative for rhinorrhea and sore throat.   Eyes: Negative for redness.  Respiratory: Negative for cough.   Cardiovascular: Negative for chest pain.   Gastrointestinal: Positive for nausea, abdominal pain and diarrhea. Negative for vomiting and blood in stool.  Genitourinary: Negative for dysuria.  Musculoskeletal: Negative for myalgias.  Skin: Negative for rash.  Neurological: Negative for headaches.    Allergies  Diflucan; Percocet; and Tramadol  Home Medications   Prior to Admission medications   Medication Sig Start Date End Date Taking? Authorizing Provider  HYDROcodone-acetaminophen (NORCO) 5-325 MG per tablet Take 2 tablets by mouth every 4 (four) hours as needed for pain. 04/24/13   Geoffery Lyonsouglas Delo, MD  metroNIDAZOLE (FLAGYL) 500 MG tablet Take 1 tablet (500 mg total) by mouth 2 (two) times daily. One po bid x 7 days 04/24/13   Geoffery Lyonsouglas Delo, MD  terconazole (TERAZOL 3) 0.8 % vaginal cream Place 1 applicator vaginally at bedtime. 04/24/13   Geoffery Lyonsouglas Delo, MD   BP 130/76 mmHg  Pulse 90  Temp(Src) 98.5 F (36.9 C)  Resp 18  Ht 5\' 7"  (1.702 m)  Wt 180 lb 1 oz (81.676 kg)  BMI 28.20 kg/m2  SpO2 100%  LMP 11/02/2014   Physical Exam  Constitutional: She appears well-developed and well-nourished.  HENT:  Head: Normocephalic and atraumatic.  Eyes: Conjunctivae are normal. Right eye exhibits no discharge. Left eye exhibits no discharge.  Neck: Normal range of motion. Neck supple.  Cardiovascular: Normal rate, regular rhythm and normal  heart sounds.   Pulmonary/Chest: Effort normal and breath sounds normal.  Abdominal: Soft. Bowel sounds are normal. There is tenderness (mild) in the epigastric area. There is no rebound, no guarding and no CVA tenderness.  Genitourinary: Rectal exam shows no external hemorrhoid, no internal hemorrhoid, no fissure, no mass, no tenderness and anal tone normal. Guaiac negative stool.  Neurological: She is alert.  Skin: Skin is warm and dry.  Psychiatric: She has a normal mood and affect.  Nursing note and vitals reviewed.   ED Course  Procedures (including critical care time) Labs Review Labs  Reviewed  CBC WITH DIFFERENTIAL/PLATELET - Abnormal; Notable for the following:    RBC 3.71 (*)    Hemoglobin 7.4 (*)    HCT 24.1 (*)    MCV 65.0 (*)    MCH 19.9 (*)    RDW 18.1 (*)    Platelets 453 (*)    All other components within normal limits  COMPREHENSIVE METABOLIC PANEL - Abnormal; Notable for the following:    Anion gap 4 (*)    All other components within normal limits  PREGNANCY, URINE  URINALYSIS, ROUTINE W REFLEX MICROSCOPIC  LIPASE, BLOOD  OCCULT BLOOD X 1 CARD TO LAB, STOOL  POC OCCULT BLOOD, ED    Imaging Review No results found.   EKG Interpretation None       2:41 PM Patient seen and examined. Work-up initiated. Medications ordered.   Vital signs reviewed and are as follows: BP 130/76 mmHg  Pulse 90  Temp(Src) 98.5 F (36.9 C)  Resp 18  Ht  (1.702 m)  Wt 180 lb 1 oz (81.676 kg)  BMI 28.20 kg/m2  SpO2 100%  LMP 11/02/2014  4:38 PM Rectal exam performed earlier with NT chaperone. Hemoccult negative.  Patient informed of all results including her anemia which is likely due to iron deficiency. Will start patient on iron supplementation. Also will give omeprazole and Carafate for home. Small amount of a medication given for severe pain. Patient encouraged to follow-up with her GYN next week. Also, referral to GI given for evaluation of possible peptic ulcer disease. Patient counseled to decrease alcohol use, avoid NSAIDs.  The patient was urged to return to the Emergency Department immediately with worsening of current symptoms, worsening abdominal pain, persistent vomiting, blood noted in stools, fever, or any other concerns. The patient verbalized understanding.      MDM   Final diagnoses:  Epigastric pain  Iron deficiency anemia   Patient with epigastric pain. Her hemoglobin is 7.4 today. It appears that she has had some mild anemia in the past but not to this extent. Suspect that this is chronic in nature. Patient does not have a  tachycardia. She does have general fatigue but is not having syncope. No active severe bleeding on exam. Heme-negative stools. Patient does admit to having heavy menstrual periods.  Her epigastric pain is most likely due to peptic ulcer disease or gastritis due to her alcohol use. Treatment as above. Follow-ups as above. Patient appears well. Feel that she has stable for discharge home. Her symptoms are not severe enough to warrant blood transfusion at this time.    Renne Crigler, PA-C 11/12/14 1641  Gilda Crease, MD 11/15/14 [phone]

## 2014-11-12 NOTE — ED Notes (Signed)
Presents to ED w/ c/o weakness, tiredness, HA, joint pain, diarrhea, N&V for past two (2) days. Pain in abdomen is primary visit today

## 2014-11-12 NOTE — ED Notes (Signed)
Pain med order rec'd, immeidately implemented per order

## 2014-11-12 NOTE — ED Notes (Signed)
PA-C informed of negative occult blood test

## 2014-11-12 NOTE — ED Notes (Signed)
Blood drawn from IV saline site, sent to lab per EDP orders

## 2014-11-14 ENCOUNTER — Encounter (HOSPITAL_BASED_OUTPATIENT_CLINIC_OR_DEPARTMENT_OTHER): Payer: Self-pay

## 2014-11-14 ENCOUNTER — Emergency Department (HOSPITAL_BASED_OUTPATIENT_CLINIC_OR_DEPARTMENT_OTHER): Payer: Medicaid Other

## 2014-11-14 ENCOUNTER — Emergency Department (HOSPITAL_BASED_OUTPATIENT_CLINIC_OR_DEPARTMENT_OTHER)
Admission: EM | Admit: 2014-11-14 | Discharge: 2014-11-14 | Disposition: A | Discharge status: Home / Self Care | Payer: Medicaid Other | Attending: Emergency Medicine | Admitting: Emergency Medicine

## 2014-11-14 DIAGNOSIS — Z8701 Personal history of pneumonia (recurrent): Secondary | ICD-10-CM | POA: Diagnosis not present

## 2014-11-14 DIAGNOSIS — Z9889 Other specified postprocedural states: Secondary | ICD-10-CM | POA: Insufficient documentation

## 2014-11-14 DIAGNOSIS — F419 Anxiety disorder, unspecified: Secondary | ICD-10-CM | POA: Diagnosis not present

## 2014-11-14 DIAGNOSIS — F329 Major depressive disorder, single episode, unspecified: Secondary | ICD-10-CM | POA: Insufficient documentation

## 2014-11-14 DIAGNOSIS — R102 Pelvic and perineal pain: Secondary | ICD-10-CM | POA: Insufficient documentation

## 2014-11-14 DIAGNOSIS — Z9851 Tubal ligation status: Secondary | ICD-10-CM | POA: Diagnosis not present

## 2014-11-14 DIAGNOSIS — Z79899 Other long term (current) drug therapy: Secondary | ICD-10-CM | POA: Insufficient documentation

## 2014-11-14 DIAGNOSIS — J45909 Unspecified asthma, uncomplicated: Secondary | ICD-10-CM | POA: Insufficient documentation

## 2014-11-14 DIAGNOSIS — R1031 Right lower quadrant pain: Secondary | ICD-10-CM | POA: Diagnosis not present

## 2014-11-14 DIAGNOSIS — D649 Anemia, unspecified: Secondary | ICD-10-CM | POA: Diagnosis not present

## 2014-11-14 DIAGNOSIS — R109 Unspecified abdominal pain: Secondary | ICD-10-CM

## 2014-11-14 DIAGNOSIS — Z720 Tobacco use: Secondary | ICD-10-CM | POA: Insufficient documentation

## 2014-11-14 LAB — COMPREHENSIVE METABOLIC PANEL
ALT: 13 U/L — AB (ref 14–54)
ANION GAP: 7 (ref 5–15)
AST: 18 U/L (ref 15–41)
Albumin: 3.8 g/dL (ref 3.5–5.0)
Alkaline Phosphatase: 66 U/L (ref 38–126)
BUN: 10 mg/dL (ref 6–20)
CO2: 25 mmol/L (ref 22–32)
Calcium: 8.7 mg/dL — ABNORMAL LOW (ref 8.9–10.3)
Chloride: 105 mmol/L (ref 101–111)
Creatinine, Ser: 0.62 mg/dL (ref 0.44–1.00)
GFR calc non Af Amer: >60 mL/min (ref >60)
GLUCOSE: 87 mg/dL (ref 70–99)
Potassium: 3.6 mmol/L (ref 3.5–5.1)
Sodium: 137 mmol/L (ref 135–145)
Total Bilirubin: <0.1 mg/dL — ABNORMAL LOW (ref 0.3–1.2)
Total Protein: 7.8 g/dL (ref 6.5–8.1)

## 2014-11-14 LAB — CBC WITH DIFFERENTIAL/PLATELET
Basophils Absolute: 0 10*3/uL (ref 0.0–0.1)
Basophils Relative: 0 % (ref 0–1)
EOS PCT: 1 % (ref 0–5)
Eosinophils Absolute: 0.1 10*3/uL (ref 0.0–0.7)
HCT: 23.4 % — ABNORMAL LOW (ref 36.0–46.0)
HEMOGLOBIN: 7.1 g/dL — AB (ref 12.0–15.0)
LYMPHS ABS: 1.9 10*3/uL (ref 0.7–4.0)
Lymphocytes Relative: 33 % (ref 12–46)
MCH: 19.9 pg — AB (ref 26.0–34.0)
MCHC: 30.3 g/dL (ref 30.0–36.0)
MCV: 65.5 fL — ABNORMAL LOW (ref 78.0–100.0)
Monocytes Absolute: 0.7 10*3/uL (ref 0.1–1.0)
Monocytes Relative: 12 % (ref 3–12)
Neutro Abs: 3.1 10*3/uL (ref 1.7–7.7)
Neutrophils Relative %: 54 % (ref 43–77)
PLATELETS: 416 10*3/uL — AB (ref 150–400)
RBC: 3.57 MIL/uL — ABNORMAL LOW (ref 3.87–5.11)
RDW: 18.2 % — ABNORMAL HIGH (ref 11.5–15.5)
WBC: 5.8 10*3/uL (ref 4.0–10.5)

## 2014-11-14 LAB — WET PREP, GENITAL
TRICH WET PREP: NONE SEEN
Yeast Wet Prep HPF POC: NONE SEEN

## 2014-11-14 LAB — URINALYSIS, ROUTINE W REFLEX MICROSCOPIC
BILIRUBIN URINE: NEGATIVE
GLUCOSE, UA: NEGATIVE mg/dL
Hgb urine dipstick: NEGATIVE
Ketones, ur: NEGATIVE mg/dL
LEUKOCYTES UA: NEGATIVE
Nitrite: NEGATIVE
PH: 5.5 (ref 5.0–8.0)
Protein, ur: NEGATIVE mg/dL
Specific Gravity, Urine: 1.009 (ref 1.005–1.030)
Urobilinogen, UA: 0.2 mg/dL (ref 0.0–1.0)

## 2014-11-14 MED ORDER — PROCHLORPERAZINE EDISYLATE 5 MG/ML IJ SOLN
10.0000 mg | Freq: Once | INTRAMUSCULAR | Status: AC
  Administered 2014-11-14: 10 mg via INTRAVENOUS
  Filled 2014-11-14: qty 2

## 2014-11-14 MED ORDER — SODIUM CHLORIDE 0.9 % IV BOLUS (SEPSIS)
1000.0000 mL | INTRAVENOUS | Status: AC
  Administered 2014-11-14: 1000 mL via INTRAVENOUS

## 2014-11-14 MED ORDER — DIPHENHYDRAMINE HCL 50 MG/ML IJ SOLN
25.0000 mg | Freq: Once | INTRAMUSCULAR | Status: AC
  Administered 2014-11-14: 25 mg via INTRAVENOUS
  Filled 2014-11-14: qty 1

## 2014-11-14 MED ORDER — MORPHINE SULFATE 4 MG/ML IJ SOLN
4.0000 mg | Freq: Once | INTRAMUSCULAR | Status: AC
  Administered 2014-11-14: 4 mg via INTRAVENOUS
  Filled 2014-11-14: qty 1

## 2014-11-14 MED ORDER — KETOROLAC TROMETHAMINE 30 MG/ML IJ SOLN
30.0000 mg | Freq: Once | INTRAMUSCULAR | Status: AC
  Administered 2014-11-14: 30 mg via INTRAVENOUS
  Filled 2014-11-14: qty 1

## 2014-11-14 NOTE — Discharge Instructions (Signed)
Please follow directions provided. Be sure to establish care with a primary care doctor for further management of medical problems. Please follow-up with the gastroenterologist for further management of your upper, epigastric stomach pains. Be sure to drink plenty of fluids to stay well hydrated. Please take all medications as prescribed including your iron tablets, Carafate tablets and omeprazole. This will help you with your ongoing symptoms. Don't hesitate to return for any new, worsening, or concerning symptoms.   SEEK IMMEDIATE MEDICAL CARE IF:  Your pain does not go away within 2 hours.  You keep throwing up (vomiting).  Your pain is felt only in portions of the abdomen, such as the right side or the left lower portion of the abdomen.  You pass bloody or black tarry stools.

## 2014-11-14 NOTE — ED Notes (Signed)
EDP at bedside  

## 2014-11-14 NOTE — ED Provider Notes (Signed)
CSN: [id number]     Arrival date & time 11/14/14  1505 History   First MD Initiated Contact with Patient 11/14/14 1605     Chief Complaint  Patient presents with  . Abdominal Pain   (Consider location/radiation/quality/duration/timing/severity/associated sxs/prior Treatment) HPI  Rhonda Whitaker is a 29 yo female presenting with abd pain.  She states she was seen here two days ago for her recurrent epigastric pain but she is here today for a new pain.  She reports she had new onset of righ flank pain with radiation to her RLQ starting yesterday.  She rates the pain as 10/10 and reports it has worsened since then.  She describes it as a sharp pain.  Her last BM was this am and was hard.  She had vomiting and diarrhea 2 days ago but has not had any diarrhea or vomiting today.  She also denies fever, urinary symptoms, or vaginal symptoms.  Past Medical History  Diagnosis Date  . Asthma   . Pneumonia   . Fatigue   . Anemia   . Anxiety   . Depression     No meds. currently. Pt. stopped taking  . Loss of appetite    Past Surgical History  Procedure Laterality Date  . Cesarean section    . Tubal ligation     No family history on file. History  Substance Use Topics  . Smoking status: Current Every Day Smoker -- 1.00 packs/day    Types: Cigarettes  . Smokeless tobacco: Never Used  . Alcohol Use: 8.4 oz/week    14 Cans of beer per week     Comment: 2 (40oz) bottles per day   OB History    Gravida Para Term Preterm AB TAB SAB Ectopic Multiple Living   6 5 5  1  1  1 5      Review of Systems  Constitutional: Negative for fever and chills.  HENT: Negative for sore throat.   Eyes: Negative for visual disturbance.  Respiratory: Negative for cough and shortness of breath.   Cardiovascular: Negative for chest pain and leg swelling.  Gastrointestinal: Positive for abdominal pain. Negative for nausea, vomiting and diarrhea.  Genitourinary: Negative for dysuria.  Musculoskeletal:  Negative for myalgias.  Skin: Negative for rash.  Neurological: Negative for weakness, numbness and headaches.     Allergies  Diflucan; Percocet; and Tramadol  Home Medications   Prior to Admission medications   Medication Sig Start Date End Date Taking? Authorizing Provider  SERTRALINE HCL PO Take by mouth.   Yes Historical Provider, MD  TRAZODONE HCL PO Take by mouth.   Yes Historical Provider, MD  ferrous sulfate 325 (65 FE) MG tablet Take 1 tablet (325 mg total) by mouth 2 (two) times daily with a meal. 11/12/14   Renne Crigler, PA-C  HYDROcodone-acetaminophen (NORCO/VICODIN) 5-325 MG per tablet Take 1-2 tablets every 6 hours as needed for severe pain 11/12/14   Renne Crigler, PA-C  omeprazole (PRILOSEC) 20 MG capsule Take one capsule PO twice a day for 3 days, then one capsule PO once a day 11/12/14   Renne Crigler, PA-C  sucralfate (CARAFATE) 1 G tablet Take 1 tablet (1 g total) by mouth 4 (four) times daily -  with meals and at bedtime. 11/12/14   Renne Crigler, PA-C   BP 128/81 mmHg  Pulse 94  Temp(Src) 98.6 F (37 C) (Oral)  Resp 20  Ht 5\' 7"  (1.702 m)  Wt 180 lb (81.647 kg)  BMI 28.19 kg/m2  SpO2 100%  LMP 11/02/2014 Physical Exam  Constitutional: She appears well-developed and well-nourished. No distress.  HENT:  Head: Normocephalic and atraumatic.  Mouth/Throat: Oropharynx is clear and moist. No oropharyngeal exudate.  Eyes: Conjunctivae are normal.  Neck: Neck supple. No thyromegaly present.  Cardiovascular: Normal rate, regular rhythm and intact distal pulses.   Pulmonary/Chest: Effort normal and breath sounds normal. No respiratory distress. She has no wheezes. She has no rales. She exhibits no tenderness.  Abdominal: Soft. Bowel sounds are normal. She exhibits no distension and no mass. There is no hepatosplenomegaly. There is tenderness in the right lower quadrant. There is no rigidity, no rebound, no guarding, no CVA tenderness, no tenderness at McBurney's point  and negative Murphy's sign.    Genitourinary: There is no tenderness or lesion on the right labia. There is no tenderness or lesion on the left labia. Cervix exhibits no motion tenderness, no discharge and no friability. Right adnexum displays tenderness. Left adnexum displays no tenderness. No vaginal discharge found.  Musculoskeletal: She exhibits no tenderness.  Lymphadenopathy:    She has no cervical adenopathy.  Neurological: She is alert.  Skin: Skin is warm and dry. No rash noted. She is not diaphoretic.  Psychiatric: She has a normal mood and affect.  Nursing note and vitals reviewed.   ED Course  Procedures (including critical care time) Labs Review Labs Reviewed  WET PREP, GENITAL - Abnormal; Notable for the following:    Clue Cells Wet Prep HPF POC FEW (*)    WBC, Wet Prep HPF POC FEW (*)    All other components within normal limits  CBC WITH DIFFERENTIAL/PLATELET - Abnormal; Notable for the following:    RBC 3.57 (*)    Hemoglobin 7.1 (*)    HCT 23.4 (*)    MCV 65.5 (*)    MCH 19.9 (*)    RDW 18.2 (*)    Platelets 416 (*)    All other components within normal limits  COMPREHENSIVE METABOLIC PANEL - Abnormal; Notable for the following:    Calcium 8.7 (*)    ALT 13 (*)    Total Bilirubin <0.1 (*)    All other components within normal limits  URINALYSIS, ROUTINE W REFLEX MICROSCOPIC  GC/CHLAMYDIA PROBE AMP ([place])    Imaging Review US Transvaginal Non-ob  11/14/2014   CLINICAL DATA:  Right adnexal tenderness for 2 weeks  EXAM: TRANSABDOMINAL AND TRANSVAGINAL ULTRASOUND OF PELVIS  DOPPLER ULTRASOUND OF OVARIES  TECHNIQUE: Both transabdominal and transvaginal ultrasound examinations of the pelvis were performed. Transabdominal technique was performed for global imaging of the pelvis including uterus, ovaries, adnexal regions, and pelvic cul-de-sac.  It was necessary to proceed with endovaginal exam following the transabdominal exam to visualize the ovaries. Color  and duplex Doppler ultrasound was utilized to evaluate blood flow to the ovaries.  COMPARISON:  None.  FINDINGS: Uterus  Measurements: Normal in size at 9.9 x 5.0 x 5.4 cm. The myometrium is heterogeneous without discrete measurable leiomyomas.  Endometrium  Thickness: Normal in thickness for premenopausal female at 9 mm. No focal abnormality visualized.  Right ovary  Measurements: Normal in size at 3.9 x 3.2 x 2.7 cm normal color Doppler flow and spectral waveforms.  Left ovary  Measurements: Normal in size at 3.4 x 2.3 x 2.2 cm. Normal color Doppler flow and spectral waveforms.  Pulsed Doppler evaluation of both ovaries demonstrates normal low-resistance arterial and venous waveforms.  Other findings  Small amount free fluid.  IMPRESSION: 1. Heterogeneous uterus  may represent underlying leiomyomas. 2. Endometrium is normal thickness for premenopausal female. 3. Normal ovaries with normal arterial and venous flow. 4. Small volume free fluid.   Electronically Signed   By: Genevive BiStewart  Edmunds M.D.   On: 11/14/2014 21:11   [place] Pelvis Complete  11/14/2014   CLINICAL DATA:  Right adnexal tenderness for 2 weeks  EXAM: TRANSABDOMINAL AND TRANSVAGINAL ULTRASOUND OF PELVIS  DOPPLER ULTRASOUND OF OVARIES  TECHNIQUE: Both transabdominal and transvaginal ultrasound examinations of the pelvis were performed. Transabdominal technique was performed for global imaging of the pelvis including uterus, ovaries, adnexal regions, and pelvic cul-de-sac.  It was necessary to proceed with endovaginal exam following the transabdominal exam to visualize the ovaries. Color and duplex Doppler ultrasound was utilized to evaluate blood flow to the ovaries.  COMPARISON:  None.  FINDINGS: Uterus  Measurements: Normal in size at 9.9 x 5.0 x 5.4 cm. The myometrium is heterogeneous without discrete measurable leiomyomas.  Endometrium  Thickness: Normal in thickness for premenopausal female at 9 mm. No focal abnormality visualized.  Right ovary   Measurements: Normal in size at 3.9 x 3.2 x 2.7 cm normal color Doppler flow and spectral waveforms.  Left ovary  Measurements: Normal in size at 3.4 x 2.3 x 2.2 cm. Normal color Doppler flow and spectral waveforms.  Pulsed Doppler evaluation of both ovaries demonstrates normal low-resistance arterial and venous waveforms.  Other findings  Small amount free fluid.  IMPRESSION: 1. Heterogeneous uterus may represent underlying leiomyomas. 2. Endometrium is normal thickness for premenopausal female. 3. Normal ovaries with normal arterial and venous flow. 4. Small volume free fluid.   Electronically Signed   By: Genevive BiStewart  Edmunds M.D.   On: 11/14/2014 21:11   [place] Art/ven Flow Abd Pelv Doppler  11/14/2014   : CLINICAL DATA: Right adnexal tenderness for 2 weeks  EXAM: TRANSABDOMINAL AND TRANSVAGINAL ULTRASOUND OF PELVIS  DOPPLER ULTRASOUND OF OVARIES  TECHNIQUE: Both transabdominal and transvaginal ultrasound examinations of the pelvis were performed. Transabdominal technique was performed for global imaging of the pelvis including uterus, ovaries, adnexal regions, and pelvic cul-de-sac.  It was necessary to proceed with endovaginal exam following the transabdominal exam to visualize the ovaries. Color and duplex Doppler ultrasound was utilized to evaluate blood flow to the ovaries.  COMPARISON: None.  FINDINGS: Uterus  Measurements: Normal in size at 9.9 x 5.0 x 5.4 cm. The myometrium is heterogeneous without discrete measurable leiomyomas.  Endometrium  Thickness: Normal in thickness for premenopausal female at 9 mm. No focal abnormality visualized.  Right ovary  Measurements: Normal in size at 3.9 x 3.2 x 2.7 cm normal color Doppler flow and spectral waveforms.  Left ovary  Measurements: Normal in size at 3.4 x 2.3 x 2.2 cm. Normal color Doppler flow and spectral waveforms.  Pulsed Doppler evaluation of both ovaries demonstrates normal low-resistance arterial and venous waveforms.  Other findings  Small amount free  fluid.  IMPRESSION: 1. Heterogeneous uterus may represent underlying leiomyomas. 2. Endometrium is normal thickness for premenopausal female. 3. Normal ovaries with normal arterial and venous flow. 4. Small volume free fluid.   Electronically Signed   By: Genevive BiStewart  Edmunds M.D.   On: 11/14/2014 21:15   Ct Renal Stone Study  11/14/2014   CLINICAL DATA:  Right flank pain for several months. Nausea 11/12/2014.  EXAM: CT ABDOMEN AND PELVIS WITHOUT CONTRAST  TECHNIQUE: Multidetector CT imaging of the abdomen and pelvis was performed following the standard protocol without IV contrast.  COMPARISON:  CT abdomen and  pelvis 01/27/2013.  FINDINGS: The lung bases are clear.  No pleural or pericardial effusion.  No renal or ureteral stones are identified. There is no hydronephrosis on the right or left. The urinary bladder and uterus and adnexa appear normal.  The gallbladder, liver, adrenal glands, spleen, pancreas and biliary tree all appear normal. The stomach, small and large bowel and appendix appear normal. There is no lymphadenopathy or fluid. No bony abnormality is identified.  IMPRESSION: Negative for urinary tract stone.  Negative CT abdomen and pelvis.   Electronically Signed   By: Drusilla Kanner M.D.   On: 11/14/2014 18:20     EKG Interpretation None      MDM   Final diagnoses:  Right flank pain  Adnexal tenderness, right   29 yo with recurrent epigastric pain who has not started treatment since last ED visit with report of new RLQ pain. Patient's pain and other symptoms adequately managed in emergency department.  Fluid bolus given.  Labs, imaging and vitals reviewed.  Patient does not meet the SIRS or Sepsis criteria. Non contrast Abd CT negative for acute abnormality. Her pelvic exam shows right adnexal tenderness, Pelvis US is negative for ovarian torsion.  Her pain has improved on subsequent exams. Discussed her chronic anemia which appears to be due to iron deficiency. She reports having iron  supplements and plans to start taking. No indication of appendicitis, bowel obstruction, bowel perforation, cholecystitis, diverticulitis, PID or ectopic pregnancy.  Patient discharged home with symptomatic treatment and given strict instructions for follow-up with their primary care physician.  Pt is well-appearing, in no acute distress and vital signs are stable.  They appear safe to be discharged.  Discharge include follow-up with their PCP.  Return precautions provided. Patient expresses understanding and agrees with plan.  Filed Vitals:   11/14/14 1720 11/14/14 1811 11/14/14 2052 11/14/14 2216  BP:  127/72 115/68 120/65  Pulse:  87 62 61  Temp: 98.8 F (37.1 C)  98.8 F (37.1 C)   TempSrc: Oral  Oral   Resp:  Height:      Weight:      SpO2:  100% 99% 99%   Meds given in ED:  Medications  sodium chloride 0.9 % bolus 1,000 mL (0 mLs Intravenous Stopped 11/14/14 1719)  ketorolac (TORADOL) 30 MG/ML injection 30 mg (30 mg Intravenous Given 11/14/14 1648)  prochlorperazine (COMPAZINE) injection 10 mg (10 mg Intravenous Given 11/14/14 1649)  diphenhydrAMINE (BENADRYL) injection 25 mg (25 mg Intravenous Given 11/14/14 1649)  morphine 4 MG/ML injection 4 mg (4 mg Intravenous Given 11/14/14 1945)    Discharge Medication List as of 11/14/2014 10:20 PM         Harle Battiest, NP 11/15/14 1652  Rolan Bucco, MD 11/15/14 2329

## 2014-11-14 NOTE — ED Notes (Signed)
C/o cont'd abd pain-seen here 4/30 for same-also c/o right flank pain

## 2014-11-15 LAB — GC/CHLAMYDIA PROBE AMP (~~LOC~~) NOT AT ARMC
Chlamydia: NEGATIVE
NEISSERIA GONORRHEA: NEGATIVE

## 2015-01-30 ENCOUNTER — Emergency Department (HOSPITAL_BASED_OUTPATIENT_CLINIC_OR_DEPARTMENT_OTHER)
Admission: EM | Admit: 2015-01-30 | Discharge: 2015-01-30 | Disposition: A | Discharge status: Other Acute Inpatient Hospital | Payer: Self-pay | Attending: Emergency Medicine | Admitting: Emergency Medicine

## 2015-01-30 ENCOUNTER — Emergency Department (HOSPITAL_BASED_OUTPATIENT_CLINIC_OR_DEPARTMENT_OTHER): Payer: Medicaid Other

## 2015-01-30 ENCOUNTER — Emergency Department (HOSPITAL_BASED_OUTPATIENT_CLINIC_OR_DEPARTMENT_OTHER): Payer: Self-pay

## 2015-01-30 ENCOUNTER — Encounter (HOSPITAL_BASED_OUTPATIENT_CLINIC_OR_DEPARTMENT_OTHER): Payer: Self-pay | Admitting: *Deleted

## 2015-01-30 DIAGNOSIS — Z79899 Other long term (current) drug therapy: Secondary | ICD-10-CM | POA: Insufficient documentation

## 2015-01-30 DIAGNOSIS — R0602 Shortness of breath: Secondary | ICD-10-CM | POA: Insufficient documentation

## 2015-01-30 DIAGNOSIS — Z720 Tobacco use: Secondary | ICD-10-CM | POA: Insufficient documentation

## 2015-01-30 DIAGNOSIS — D649 Anemia, unspecified: Secondary | ICD-10-CM | POA: Insufficient documentation

## 2015-01-30 DIAGNOSIS — R0789 Other chest pain: Secondary | ICD-10-CM | POA: Insufficient documentation

## 2015-01-30 DIAGNOSIS — Z8701 Personal history of pneumonia (recurrent): Secondary | ICD-10-CM | POA: Insufficient documentation

## 2015-01-30 DIAGNOSIS — F329 Major depressive disorder, single episode, unspecified: Secondary | ICD-10-CM | POA: Insufficient documentation

## 2015-01-30 DIAGNOSIS — R079 Chest pain, unspecified: Secondary | ICD-10-CM

## 2015-01-30 DIAGNOSIS — Z3202 Encounter for pregnancy test, result negative: Secondary | ICD-10-CM | POA: Insufficient documentation

## 2015-01-30 DIAGNOSIS — J45909 Unspecified asthma, uncomplicated: Secondary | ICD-10-CM | POA: Insufficient documentation

## 2015-01-30 DIAGNOSIS — F419 Anxiety disorder, unspecified: Secondary | ICD-10-CM | POA: Insufficient documentation

## 2015-01-30 LAB — COMPREHENSIVE METABOLIC PANEL
ALK PHOS: 64 U/L (ref 38–126)
ALT: 13 U/L — AB (ref 14–54)
ANION GAP: 6 (ref 5–15)
AST: 21 U/L (ref 15–41)
Albumin: 3.5 g/dL (ref 3.5–5.0)
BILIRUBIN TOTAL: 0.5 mg/dL (ref 0.3–1.2)
BUN: 10 mg/dL (ref 6–20)
CALCIUM: 8.5 mg/dL — AB (ref 8.9–10.3)
CO2: 24 mmol/L (ref 22–32)
Chloride: 107 mmol/L (ref 101–111)
Creatinine, Ser: 0.72 mg/dL (ref 0.44–1.00)
GFR calc Af Amer: >60 mL/min (ref >60)
GFR calc non Af Amer: >60 mL/min (ref >60)
Glucose, Bld: 100 mg/dL — ABNORMAL HIGH (ref 65–99)
POTASSIUM: 3.5 mmol/L (ref 3.5–5.1)
Sodium: 137 mmol/L (ref 135–145)
Total Protein: 7.4 g/dL (ref 6.5–8.1)

## 2015-01-30 LAB — PREGNANCY, URINE: Preg Test, Ur: NEGATIVE

## 2015-01-30 LAB — TROPONIN I: Troponin I: <0.03 ng/mL (ref <0.031)

## 2015-01-30 LAB — CBC WITH DIFFERENTIAL/PLATELET
BASOS ABS: 0.1 10*3/uL (ref 0.0–0.1)
BASOS PCT: 1 % (ref 0–1)
EOS ABS: 0.1 10*3/uL (ref 0.0–0.7)
EOS PCT: 2 % (ref 0–5)
HEMATOCRIT: 23.2 % — AB (ref 36.0–46.0)
Hemoglobin: 6.8 g/dL — CL (ref 12.0–15.0)
Lymphocytes Relative: 29 % (ref 12–46)
Lymphs Abs: 1.5 10*3/uL (ref 0.7–4.0)
MCH: 19.5 pg — AB (ref 26.0–34.0)
MCHC: 29.3 g/dL — ABNORMAL LOW (ref 30.0–36.0)
MCV: 66.5 fL — ABNORMAL LOW (ref 78.0–100.0)
MONO ABS: 0.6 10*3/uL (ref 0.1–1.0)
Monocytes Relative: 11 % (ref 3–12)
NEUTROS PCT: 57 % (ref 43–77)
Neutro Abs: 2.9 10*3/uL (ref 1.7–7.7)
Platelets: 399 10*3/uL (ref 150–400)
RBC: 3.49 MIL/uL — AB (ref 3.87–5.11)
RDW: 19 % — ABNORMAL HIGH (ref 11.5–15.5)
WBC: 5.2 10*3/uL (ref 4.0–10.5)

## 2015-01-30 LAB — D-DIMER, QUANTITATIVE (NOT AT ARMC): D DIMER QUANT: 0.57 ug{FEU}/mL — AB (ref 0.00–0.48)

## 2015-01-30 LAB — OCCULT BLOOD X 1 CARD TO LAB, STOOL: FECAL OCCULT BLD: NEGATIVE

## 2015-01-30 MED ORDER — HYDROMORPHONE HCL 1 MG/ML IJ SOLN
0.5000 mg | Freq: Once | INTRAMUSCULAR | Status: AC
  Administered 2015-01-30: 0.5 mg via INTRAVENOUS
  Filled 2015-01-30: qty 1

## 2015-01-30 MED ORDER — DIPHENHYDRAMINE HCL 50 MG/ML IJ SOLN
25.0000 mg | Freq: Once | INTRAMUSCULAR | Status: AC
  Administered 2015-01-30: 25 mg via INTRAVENOUS
  Filled 2015-01-30: qty 1

## 2015-01-30 MED ORDER — HYDROMORPHONE HCL 1 MG/ML IJ SOLN: 0.5000 mg | Freq: Once | INTRAMUSCULAR | Status: DC

## 2015-01-30 MED ORDER — IOHEXOL 350 MG/ML SOLN
100.0000 mL | Freq: Once | INTRAVENOUS | Status: AC | PRN
  Administered 2015-01-30: 100 mL via INTRAVENOUS

## 2015-01-30 MED ORDER — SODIUM CHLORIDE 0.9 % IV BOLUS (SEPSIS)
1000.0000 mL | INTRAVENOUS | Status: AC
  Administered 2015-01-30: 1000 mL via INTRAVENOUS

## 2015-01-30 NOTE — ED Notes (Signed)
Woke with chest pain yesterday am. Stabbing pain on the left side of her chest.

## 2015-01-30 NOTE — ED Notes (Signed)
Lab reports critical Hgb level of 6.8. Primary RN and EDP notified.

## 2015-01-30 NOTE — ED Notes (Signed)
Called hospitalist direct--will call back to [phone]

## 2015-01-30 NOTE — ED Provider Notes (Signed)
CSN: [id number]     Arrival date & time 01/30/15  1129 History   First MD Initiated Contact with Patient 01/30/15 1136     Chief Complaint  Patient presents with  . Chest Pain     (Consider location/radiation/quality/duration/timing/severity/associated sxs/prior Treatment) Patient is a 29 y.o. female presenting with chest pain. The history is provided by the patient.  Chest Pain Pain location:  L chest Pain quality: sharp   Pain radiates to:  Does not radiate Pain radiates to the back: no   Pain severity:  Moderate Onset quality:  Sudden Duration:  1 day Timing:  Constant Progression:  Unchanged Chronicity:  New Context: breathing and at rest   Relieved by:  Nothing Worsened by:  Nothing tried Ineffective treatments:  None tried Associated symptoms: shortness of breath   Associated symptoms: no abdominal pain, no back pain, no cough, no dizziness, no fatigue, no fever, no headache, no nausea and not vomiting     Past Medical History  Diagnosis Date  . Asthma   . Pneumonia   . Fatigue   . Anemia   . Anxiety   . Depression     No meds. currently. Pt. stopped taking  . Loss of appetite    Past Surgical History  Procedure Laterality Date  . Cesarean section    . Tubal ligation     No family history on file. History  Substance Use Topics  . Smoking status: Current Every Day Smoker -- 1.00 packs/day    Types: Cigarettes  . Smokeless tobacco: Never Used  . Alcohol Use: 8.4 oz/week    14 Cans of beer per week     Comment: 2 (40oz) bottles per day   OB History    Gravida Para Term Preterm AB TAB SAB Ectopic Multiple Living   6 5 5  1  1  1 5      Review of Systems  Constitutional: Negative for fever and fatigue.  HENT: Negative for congestion and drooling.   Eyes: Negative for pain.  Respiratory: Positive for shortness of breath. Negative for cough.   Cardiovascular: Positive for chest pain.  Gastrointestinal: Negative for nausea, vomiting, abdominal pain and  diarrhea.  Genitourinary: Negative for dysuria and hematuria.  Musculoskeletal: Negative for back pain, gait problem and neck pain.  Skin: Negative for color change.  Neurological: Negative for dizziness and headaches.  Hematological: Negative for adenopathy.  Psychiatric/Behavioral: Negative for behavioral problems.  All other systems reviewed and are negative.     Allergies  Diflucan; Percocet; and Tramadol  Home Medications   Prior to Admission medications   Medication Sig Start Date End Date Taking? Authorizing Provider  ferrous sulfate 325 (65 FE) MG tablet Take 1 tablet (325 mg total) by mouth 2 (two) times daily with a meal. 11/12/14   Renne CriglerJoshua Geiple, PA-C  HYDROcodone-acetaminophen (NORCO/VICODIN) 5-325 MG per tablet Take 1-2 tablets every 6 hours as needed for severe pain 11/12/14   Renne CriglerJoshua Geiple, PA-C  omeprazole (PRILOSEC) 20 MG capsule Take one capsule PO twice a day for 3 days, then one capsule PO once a day 11/12/14   Renne CriglerJoshua Geiple, PA-C  SERTRALINE HCL PO Take by mouth.    Historical Provider, MD  sucralfate (CARAFATE) 1 G tablet Take 1 tablet (1 g total) by mouth 4 (four) times daily -  with meals and at bedtime. 11/12/14   Renne CriglerJoshua Geiple, PA-C  TRAZODONE HCL PO Take by mouth.    Historical Provider, MD   BP 118/74 mmHg  Pulse 74  Temp(Src) 97.8 F (36.6 C) (Oral)  Resp 18  Ht  (1.702 m)  Wt 180 lb (81.647 kg)  BMI 28.19 kg/m2  SpO2 100%  LMP 01/23/2015 Physical Exam  Constitutional: She is oriented to person, place, and time. She appears well-developed and well-nourished.  HENT:  Head: Normocephalic.  Mouth/Throat: Oropharynx is clear and moist. No oropharyngeal exudate.  Eyes: Conjunctivae and EOM are normal. Pupils are equal, round, and reactive to light.  Neck: Normal range of motion. Neck supple.  Cardiovascular: Normal rate, regular rhythm, normal heart sounds and intact distal pulses.  Exam reveals no gallop and no friction rub.   No murmur  heard. Pulmonary/Chest: Effort normal and breath sounds normal. No respiratory distress. She has no wheezes. She exhibits tenderness ( no focal chest wall tenderness.).  Abdominal: Soft. Bowel sounds are normal. There is no tenderness. There is no rebound and no guarding.  Musculoskeletal: Normal range of motion. She exhibits no edema or tenderness.   Symmetric lower extremities without tenderness.  Neurological: She is alert and oriented to person, place, and time.  Skin: Skin is warm and dry.  Psychiatric: She has a normal mood and affect. Her behavior is normal.  Nursing note and vitals reviewed.   ED Course  Procedures (including critical care time) Labs Review Labs Reviewed  CBC WITH DIFFERENTIAL/PLATELET - Abnormal; Notable for the following:    RBC 3.49 (*)    Hemoglobin 6.8 (*)    HCT 23.2 (*)    MCV 66.5 (*)    MCH 19.5 (*)    MCHC 29.3 (*)    RDW 19.0 (*)    All other components within normal limits  COMPREHENSIVE METABOLIC PANEL - Abnormal; Notable for the following:    Glucose, Bld 100 (*)    Calcium 8.5 (*)    ALT 13 (*)    All other components within normal limits  D-DIMER, QUANTITATIVE (NOT AT [facility]) - Abnormal; Notable for the following:    D-Dimer, Quant 0.57 (*)    All other components within normal limits  TROPONIN I  PREGNANCY, URINE  OCCULT BLOOD X 1 CARD TO LAB, STOOL    Imaging Review Dg Chest 2 View  01/30/2015   CLINICAL DATA:  Chest pain.  EXAM: CHEST  2 VIEW  COMPARISON:  None.  FINDINGS: Mediastinum and hilar structures are normal. Left base subsegmental atelectasis and or scarring noted. No pleural effusion or pneumothorax. No acute bony abnormality  IMPRESSION: Left base pleural parenchymal thickening noted consistent with subsegmental atelectasis and/or scarring.   Electronically Signed   By: Maisie Fus  Register   On: 01/30/2015 12:17   Ct Angio Chest Pe W/cm &/or Wo Cm  01/30/2015   CLINICAL DATA:  Shortness of breath, stabbing back pain between  shoulder blades, history asthma, smoking  EXAM: CT ANGIOGRAPHY CHEST WITH CONTRAST  TECHNIQUE: Multidetector CT imaging of the chest was performed using the standard protocol during bolus administration of intravenous contrast. Multiplanar CT image reconstructions and MIPs were obtained to evaluate the vascular anatomy.  CONTRAST:  OMNIPAQUE IOHEXOL 350 MG/ML SOLN IV  COMPARISON:  None.  FINDINGS: Visualized upper abdomen grossly unremarkable.  Aorta normal caliber without aneurysm or dissection.  Minimal residual thymic tissue in anterior mediastinum.  Scattered normal size thoracic nodes without adenopathy.  Pulmonary arteries well opacified and patent.  No evidence of pulmonary embolism.  Minimal scattered linear subsegmental atelectasis greatest in LEFT lower lobe.  No pulmonary infiltrate, pleural effusion or pneumothorax.  Osseous structures unremarkable.  Review of the MIP images confirms the above findings.  IMPRESSION: Minimal scattered subsegmental atelectasis.  Otherwise normal CTA chest.   Electronically Signed   By: Ulyses Southward M.D.   On: 01/30/2015 14:19     EKG Interpretation   Date/Time:  Monday January 30 2015 11:35:20 EDT Ventricular Rate:  85 PR Interval:  138 QRS Duration: 84 QT Interval:  354 QTC Calculation: 421 R Axis:   18 Text Interpretation:  Normal sinus rhythm Normal ECG Confirmed by Natalye Kott   MD, Marquerite Forsman ([id number]) on 01/30/2015 11:37:25 AM      MDM   Final diagnoses:  Chest pain  SOB (shortness of breath)  Anemia, unspecified anemia type    12:11 PM 29 y.o. female  Who presents with left-sided sharp pleuritic type chest pain and sob which began upon awakening yesterday morning. The pain has been constant. Worse with lying supine. Vital signs unremarkable here. She notes possible history of blood clot in her lung when she was admitted at [facility] for pneumonia several years ago. She is not sure though. She is at least low to moderate risk per Wells criteria.   We'll get d-dimer.   In review of care everywhere it appears that she was seen at a [facility] room yesterday with a noncontributory workup including negative chest x-ray and troponin. She did not mention this evaluation during my discussion with her. When  Questioned about this , she stated that she was informed that her pain was musculoskeletal and  She wanted a second opinion.   d-dimer was elevated and therefore I got a CT of her chest. This was negative for PE. She was found to be anemic with a hemoglobin of 6.8. Hemoglobin in her workup yesterday per care everywhere was 7.5. Her symptoms today are possibly related to symptomatic anemia. I recommended admission for transfusion. She agreed to this and preferred to go to [facility]. I spoke to Dr. Harvie Junior (hospitalist)  Who will admit her there. She was Hemoccult-negative here. Anemia likely related to heavy menstrual period and concurrent iron deficiency anemia which she  Is noncompliant with taking her iron supplements.    Purvis Sheffield, MD 01/30/15 [phone]

## 2015-01-30 NOTE — ED Notes (Signed)
[facility] Dr Delford FieldWright --[phone], [facility].

## 2015-03-05 ENCOUNTER — Encounter (HOSPITAL_BASED_OUTPATIENT_CLINIC_OR_DEPARTMENT_OTHER): Payer: Self-pay | Admitting: *Deleted

## 2015-03-05 ENCOUNTER — Emergency Department (HOSPITAL_BASED_OUTPATIENT_CLINIC_OR_DEPARTMENT_OTHER)
Admission: EM | Admit: 2015-03-05 | Discharge: 2015-03-05 | Disposition: A | Discharge status: Home / Self Care | Payer: Medicaid Other | Attending: Emergency Medicine | Admitting: Emergency Medicine

## 2015-03-05 DIAGNOSIS — Z720 Tobacco use: Secondary | ICD-10-CM | POA: Insufficient documentation

## 2015-03-05 DIAGNOSIS — D649 Anemia, unspecified: Secondary | ICD-10-CM | POA: Insufficient documentation

## 2015-03-05 DIAGNOSIS — J45909 Unspecified asthma, uncomplicated: Secondary | ICD-10-CM | POA: Insufficient documentation

## 2015-03-05 DIAGNOSIS — N739 Female pelvic inflammatory disease, unspecified: Secondary | ICD-10-CM | POA: Insufficient documentation

## 2015-03-05 DIAGNOSIS — Z792 Long term (current) use of antibiotics: Secondary | ICD-10-CM | POA: Insufficient documentation

## 2015-03-05 DIAGNOSIS — Z8701 Personal history of pneumonia (recurrent): Secondary | ICD-10-CM | POA: Insufficient documentation

## 2015-03-05 DIAGNOSIS — N730 Acute parametritis and pelvic cellulitis: Secondary | ICD-10-CM

## 2015-03-05 DIAGNOSIS — F419 Anxiety disorder, unspecified: Secondary | ICD-10-CM | POA: Insufficient documentation

## 2015-03-05 DIAGNOSIS — F329 Major depressive disorder, single episode, unspecified: Secondary | ICD-10-CM | POA: Insufficient documentation

## 2015-03-05 DIAGNOSIS — Z79899 Other long term (current) drug therapy: Secondary | ICD-10-CM | POA: Insufficient documentation

## 2015-03-05 DIAGNOSIS — Z3202 Encounter for pregnancy test, result negative: Secondary | ICD-10-CM | POA: Insufficient documentation

## 2015-03-05 LAB — WET PREP, GENITAL
Trich, Wet Prep: NONE SEEN
Yeast Wet Prep HPF POC: NONE SEEN

## 2015-03-05 LAB — URINE MICROSCOPIC-ADD ON

## 2015-03-05 LAB — URINALYSIS, ROUTINE W REFLEX MICROSCOPIC
BILIRUBIN URINE: NEGATIVE
GLUCOSE, UA: NEGATIVE mg/dL
Hgb urine dipstick: NEGATIVE
KETONES UR: NEGATIVE mg/dL
Nitrite: NEGATIVE
PH: 5.5 (ref 5.0–8.0)
Protein, ur: NEGATIVE mg/dL
SPECIFIC GRAVITY, URINE: 1.02 (ref 1.005–1.030)
Urobilinogen, UA: 0.2 mg/dL (ref 0.0–1.0)

## 2015-03-05 LAB — PREGNANCY, URINE: PREG TEST UR: NEGATIVE

## 2015-03-05 MED ORDER — ONDANSETRON HCL 4 MG PO TABS: 4.0000 mg | ORAL_TABLET | Freq: Four times a day (QID) | ORAL | Status: DC

## 2015-03-05 MED ORDER — DOXYCYCLINE HYCLATE 100 MG PO TABS
100.0000 mg | ORAL_TABLET | Freq: Once | ORAL | Status: AC
  Administered 2015-03-05: 100 mg via ORAL
  Filled 2015-03-05: qty 1

## 2015-03-05 MED ORDER — LIDOCAINE HCL (PF) 1 % IJ SOLN
INTRAMUSCULAR | Status: AC
  Administered 2015-03-05: 2.1 mL
  Filled 2015-03-05: qty 5

## 2015-03-05 MED ORDER — CEFTRIAXONE SODIUM 250 MG IJ SOLR
250.0000 mg | Freq: Once | INTRAMUSCULAR | Status: AC
  Administered 2015-03-05: 250 mg via INTRAMUSCULAR
  Filled 2015-03-05: qty 250

## 2015-03-05 MED ORDER — ONDANSETRON 4 MG PO TBDP
4.0000 mg | ORAL_TABLET | Freq: Once | ORAL | Status: AC
  Administered 2015-03-05: 4 mg via ORAL
  Filled 2015-03-05: qty 1

## 2015-03-05 MED ORDER — DOXYCYCLINE HYCLATE 100 MG PO CAPS: 100.0000 mg | ORAL_CAPSULE | Freq: Two times a day (BID) | ORAL | Status: DC

## 2015-03-05 NOTE — ED Notes (Signed)
Pt c/o vaginal odor x1.5 weeks after ending her period but the odor persists. Pt denies discharge or dysuria. Pt sts she began having lower abd pain 2-3 days ago. Pt c/o nausea and "a little" diarrhea. Pt also c/o generalized bumps x3 weeks.

## 2015-03-05 NOTE — ED Provider Notes (Signed)
CSN: [id number]     Arrival date & time 03/05/15  1048 History   First MD Initiated Contact with Patient 03/05/15 1102     Chief Complaint  Patient presents with  . Abdominal Pain     (Consider location/radiation/quality/duration/timing/severity/associated sxs/prior Treatment) HPI  Pt presenting with suprapubic pain which she states has been present for the past 2 days, worse last night.  She also states that for 2 weeks she has had an unpleasant vaginal odor- no discharge, no vaginal bleeding.  lmp ended approx 2 weeks ago.  She has tried summers eve ph balance without much help of symptoms.  C/o mild nausea, no vomiting.  Denies dysuria.  There are no other associated systemic symptoms, there are no other alleviating or modifying factors.   Past Medical History  Diagnosis Date  . Asthma   . Pneumonia   . Fatigue   . Anemia   . Anxiety   . Depression     No meds. currently. Pt. stopped taking  . Loss of appetite    Past Surgical History  Procedure Laterality Date  . Cesarean section    . Tubal ligation     No family history on file. Social History  Substance Use Topics  . Smoking status: Current Every Day Smoker -- 1.00 packs/day    Types: Cigarettes  . Smokeless tobacco: Never Used  . Alcohol Use: 8.4 oz/week    14 Cans of beer per week     Comment: 2 (40oz) bottles per day   OB History    Gravida Para Term Preterm AB TAB SAB Ectopic Multiple Living   Review of Systems  ROS reviewed and all otherwise negative except for mentioned in HPI    Allergies  Diflucan; Percocet; and Tramadol  Home Medications   Prior to Admission medications   Medication Sig Start Date End Date Taking? Authorizing Provider  doxycycline (VIBRAMYCIN) 100 MG capsule Take 1 capsule (100 mg total) by mouth 2 (two) times daily. 03/05/15   Jerelyn Scott, MD  ferrous sulfate 325 (65 FE) MG tablet Take 1 tablet (325 mg total) by mouth 2 (two) times daily with a meal.  11/12/14   Renne Crigler, PA-C  HYDROcodone-acetaminophen (NORCO/VICODIN) 5-325 MG per tablet Take 1-2 tablets every 6 hours as needed for severe pain 11/12/14   Renne Crigler, PA-C  omeprazole (PRILOSEC) 20 MG capsule Take one capsule PO twice a day for 3 days, then one capsule PO once a day 11/12/14   Renne Crigler, PA-C  ondansetron (ZOFRAN) 4 MG tablet Take 1 tablet (4 mg total) by mouth every 6 (six) hours. 03/05/15   Jerelyn Scott, MD  SERTRALINE HCL PO Take by mouth.    Historical Provider, MD  sucralfate (CARAFATE) 1 G tablet Take 1 tablet (1 g total) by mouth 4 (four) times daily -  with meals and at bedtime. 11/12/14   Renne Crigler, PA-C  TRAZODONE HCL PO Take by mouth.    Historical Provider, MD   BP 127/74 mmHg  Pulse 72  Temp(Src) 98.3 F (36.8 C) (Oral)  Resp 18  Ht  (1.727 m)  Wt 174 lb 8 oz (79.153 kg)  BMI 26.54 kg/m2  SpO2 100%  LMP 02/22/2015  Vitals reviewed Physical Exam  Physical Examination: General appearance - alert, well appearing, and in no distress Mental status - alert, oriented to person, place, and time Eyes - no conjunctival injection,  no scleral icterus Mouth - mucous membranes moist, pharynx normal without lesions Chest - clear to auscultation, no wheezes, rales or rhonchi, symmetric air entry Heart - normal rate, regular rhythm, normal S1, S2, no murmurs, rubs, clicks or gallops Abdomen - soft, nontender, nondistended, no masses or organomegaly Pelvic - normal external genitalia, + CMT and uterine tenderness on exam, no adnexal tenderness Neurological - alert, oriented, normal speech Extremities - peripheral pulses normal, no pedal edema, no clubbing or cyanosis Skin - normal coloration and turgor, no rashes  ED Course  Procedures (including critical care time) Labs Review Labs Reviewed  WET PREP, GENITAL - Abnormal; Notable for the following:    Clue Cells Wet Prep HPF POC FEW (*)    WBC, Wet Prep HPF POC FEW (*)    All other components  within normal limits  URINALYSIS, ROUTINE W REFLEX MICROSCOPIC (NOT AT [facility]) - Abnormal; Notable for the following:    APPearance CLOUDY (*)    Leukocytes, UA TRACE (*)    All other components within normal limits  URINE MICROSCOPIC-ADD ON - Abnormal; Notable for the following:    Squamous Epithelial / LPF FEW (*)    Bacteria, UA MANY (*)    Casts GRANULAR CAST (*)    All other components within normal limits  PREGNANCY, URINE  GC/CHLAMYDIA PROBE AMP ([place]) NOT AT [facility]    Imaging Review No results found. I have personally reviewed and evaluated these images and lab results as part of my medical decision-making.   EKG Interpretation None      MDM   Final diagnoses:  PID (acute pelvic inflammatory disease)    Pt presenting with c/o suprapubic pain and vaginal odor, + cmt on exam.  Will treat for PID, no adnexal tenderness or masses.  Pt given IM rocephin and doxycycline.  Discharged with strict return precautions.  Pt agreeable with plan.    Jerelyn Scott, MD 03/05/15 [phone]

## 2015-03-05 NOTE — Discharge Instructions (Signed)
Return to the ED with any concerns including fever/chills, vomiting and not able to keep down liquids or antibiotics, decreased level of alertness/lethargy, or any other alarming symptoms °

## 2015-03-06 LAB — GC/CHLAMYDIA PROBE AMP (~~LOC~~) NOT AT ARMC
Chlamydia: NEGATIVE
Neisseria Gonorrhea: NEGATIVE

## 2015-03-26 ENCOUNTER — Emergency Department (HOSPITAL_BASED_OUTPATIENT_CLINIC_OR_DEPARTMENT_OTHER)
Admission: EM | Admit: 2015-03-26 | Discharge: 2015-03-26 | Disposition: A | Discharge status: Home / Self Care | Payer: No Typology Code available for payment source | Attending: Emergency Medicine | Admitting: Emergency Medicine

## 2015-03-26 ENCOUNTER — Encounter (HOSPITAL_BASED_OUTPATIENT_CLINIC_OR_DEPARTMENT_OTHER): Payer: Self-pay

## 2015-03-26 DIAGNOSIS — Z8701 Personal history of pneumonia (recurrent): Secondary | ICD-10-CM | POA: Insufficient documentation

## 2015-03-26 DIAGNOSIS — J45909 Unspecified asthma, uncomplicated: Secondary | ICD-10-CM | POA: Diagnosis not present

## 2015-03-26 DIAGNOSIS — S3992XA Unspecified injury of lower back, initial encounter: Secondary | ICD-10-CM | POA: Diagnosis not present

## 2015-03-26 DIAGNOSIS — Z720 Tobacco use: Secondary | ICD-10-CM | POA: Diagnosis not present

## 2015-03-26 DIAGNOSIS — R21 Rash and other nonspecific skin eruption: Secondary | ICD-10-CM

## 2015-03-26 DIAGNOSIS — Z79899 Other long term (current) drug therapy: Secondary | ICD-10-CM | POA: Insufficient documentation

## 2015-03-26 DIAGNOSIS — S199XXA Unspecified injury of neck, initial encounter: Secondary | ICD-10-CM | POA: Insufficient documentation

## 2015-03-26 DIAGNOSIS — Y92410 Unspecified street and highway as the place of occurrence of the external cause: Secondary | ICD-10-CM | POA: Insufficient documentation

## 2015-03-26 DIAGNOSIS — D649 Anemia, unspecified: Secondary | ICD-10-CM | POA: Diagnosis not present

## 2015-03-26 DIAGNOSIS — S40021A Contusion of right upper arm, initial encounter: Secondary | ICD-10-CM | POA: Diagnosis not present

## 2015-03-26 DIAGNOSIS — S8992XA Unspecified injury of left lower leg, initial encounter: Secondary | ICD-10-CM | POA: Diagnosis not present

## 2015-03-26 DIAGNOSIS — Y9389 Activity, other specified: Secondary | ICD-10-CM | POA: Diagnosis not present

## 2015-03-26 DIAGNOSIS — S4991XA Unspecified injury of right shoulder and upper arm, initial encounter: Secondary | ICD-10-CM | POA: Diagnosis present

## 2015-03-26 DIAGNOSIS — Y998 Other external cause status: Secondary | ICD-10-CM | POA: Insufficient documentation

## 2015-03-26 DIAGNOSIS — Z114 Encounter for screening for human immunodeficiency virus [HIV]: Secondary | ICD-10-CM | POA: Insufficient documentation

## 2015-03-26 DIAGNOSIS — Z8659 Personal history of other mental and behavioral disorders: Secondary | ICD-10-CM | POA: Diagnosis not present

## 2015-03-26 DIAGNOSIS — M7918 Myalgia, other site: Secondary | ICD-10-CM

## 2015-03-26 LAB — RAPID HIV SCREEN (HIV 1/2 AB+AG)
HIV 1/2 Antibodies: NONREACTIVE
HIV-1 P24 Antigen - HIV24: NONREACTIVE

## 2015-03-26 LAB — PREGNANCY, URINE: Preg Test, Ur: NEGATIVE

## 2015-03-26 MED ORDER — PERMETHRIN 5 % EX CREA: TOPICAL_CREAM | CUTANEOUS | Status: DC

## 2015-03-26 MED ORDER — METHOCARBAMOL 500 MG PO TABS
750.0000 mg | ORAL_TABLET | Freq: Once | ORAL | Status: AC
  Administered 2015-03-26: 750 mg via ORAL
  Filled 2015-03-26: qty 2

## 2015-03-26 MED ORDER — METHOCARBAMOL 500 MG PO TABS: 1000.0000 mg | ORAL_TABLET | Freq: Four times a day (QID) | ORAL | Status: DC | PRN
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
## 2015-03-26 NOTE — Discharge Instructions (Signed)

## 2015-03-26 NOTE — ED Notes (Signed)
MD at bedside. 

## 2015-03-26 NOTE — ED Notes (Signed)
C/o neck pain and Rt upper arm area, Left leg, and lower back

## 2015-05-30 ENCOUNTER — Emergency Department (HOSPITAL_BASED_OUTPATIENT_CLINIC_OR_DEPARTMENT_OTHER)
Admission: EM | Admit: 2015-05-30 | Discharge: 2015-05-30 | Disposition: A | Discharge status: Home / Self Care | Payer: Medicaid Other | Attending: Emergency Medicine | Admitting: Emergency Medicine

## 2015-05-30 ENCOUNTER — Emergency Department (HOSPITAL_BASED_OUTPATIENT_CLINIC_OR_DEPARTMENT_OTHER): Payer: Medicaid Other

## 2015-05-30 ENCOUNTER — Encounter (HOSPITAL_BASED_OUTPATIENT_CLINIC_OR_DEPARTMENT_OTHER): Payer: Self-pay | Admitting: *Deleted

## 2015-05-30 DIAGNOSIS — Z8659 Personal history of other mental and behavioral disorders: Secondary | ICD-10-CM | POA: Insufficient documentation

## 2015-05-30 DIAGNOSIS — N739 Female pelvic inflammatory disease, unspecified: Secondary | ICD-10-CM | POA: Insufficient documentation

## 2015-05-30 DIAGNOSIS — B373 Candidiasis of vulva and vagina: Secondary | ICD-10-CM | POA: Diagnosis not present

## 2015-05-30 DIAGNOSIS — F17210 Nicotine dependence, cigarettes, uncomplicated: Secondary | ICD-10-CM | POA: Diagnosis not present

## 2015-05-30 DIAGNOSIS — Z8701 Personal history of pneumonia (recurrent): Secondary | ICD-10-CM | POA: Insufficient documentation

## 2015-05-30 DIAGNOSIS — Z3202 Encounter for pregnancy test, result negative: Secondary | ICD-10-CM | POA: Insufficient documentation

## 2015-05-30 DIAGNOSIS — N730 Acute parametritis and pelvic cellulitis: Secondary | ICD-10-CM

## 2015-05-30 DIAGNOSIS — Z862 Personal history of diseases of the blood and blood-forming organs and certain disorders involving the immune mechanism: Secondary | ICD-10-CM | POA: Diagnosis not present

## 2015-05-30 DIAGNOSIS — B3731 Acute candidiasis of vulva and vagina: Secondary | ICD-10-CM

## 2015-05-30 DIAGNOSIS — N751 Abscess of Bartholin's gland: Secondary | ICD-10-CM

## 2015-05-30 DIAGNOSIS — Z9851 Tubal ligation status: Secondary | ICD-10-CM | POA: Insufficient documentation

## 2015-05-30 DIAGNOSIS — J45909 Unspecified asthma, uncomplicated: Secondary | ICD-10-CM | POA: Diagnosis not present

## 2015-05-30 DIAGNOSIS — R21 Rash and other nonspecific skin eruption: Secondary | ICD-10-CM | POA: Diagnosis present

## 2015-05-30 DIAGNOSIS — N898 Other specified noninflammatory disorders of vagina: Secondary | ICD-10-CM

## 2015-05-30 DIAGNOSIS — Z9889 Other specified postprocedural states: Secondary | ICD-10-CM | POA: Diagnosis not present

## 2015-05-30 LAB — COMPREHENSIVE METABOLIC PANEL
ALBUMIN: 4 g/dL (ref 3.5–5.0)
ALK PHOS: 84 U/L (ref 38–126)
ALT: 13 U/L — ABNORMAL LOW (ref 14–54)
ANION GAP: 6 (ref 5–15)
AST: 19 U/L (ref 15–41)
BUN: 13 mg/dL (ref 6–20)
CALCIUM: 9 mg/dL (ref 8.9–10.3)
CHLORIDE: 106 mmol/L (ref 101–111)
CO2: 25 mmol/L (ref 22–32)
Creatinine, Ser: 0.7 mg/dL (ref 0.44–1.00)
GFR calc non Af Amer: >60 mL/min (ref >60)
GLUCOSE: 95 mg/dL (ref 65–99)
Potassium: 4.3 mmol/L (ref 3.5–5.1)
SODIUM: 137 mmol/L (ref 135–145)
Total Bilirubin: 0.3 mg/dL (ref 0.3–1.2)
Total Protein: 8.3 g/dL — ABNORMAL HIGH (ref 6.5–8.1)

## 2015-05-30 LAB — CBC WITH DIFFERENTIAL/PLATELET
BASOS PCT: 0 %
Basophils Absolute: 0 10*3/uL (ref 0.0–0.1)
EOS PCT: 1 %
Eosinophils Absolute: 0 10*3/uL (ref 0.0–0.7)
HEMATOCRIT: 28 % — AB (ref 36.0–46.0)
HEMOGLOBIN: 8.2 g/dL — AB (ref 12.0–15.0)
LYMPHS PCT: 28 %
Lymphs Abs: 1.3 10*3/uL (ref 0.7–4.0)
MCH: 18.8 pg — AB (ref 26.0–34.0)
MCHC: 29.3 g/dL — ABNORMAL LOW (ref 30.0–36.0)
MCV: 64.2 fL — AB (ref 78.0–100.0)
MONOS PCT: 14 %
Monocytes Absolute: 0.7 10*3/uL (ref 0.1–1.0)
NEUTROS PCT: 57 %
Neutro Abs: 2.8 10*3/uL (ref 1.7–7.7)
PLATELETS: 375 10*3/uL (ref 150–400)
RBC: 4.36 MIL/uL (ref 3.87–5.11)
RDW: 19.6 % — ABNORMAL HIGH (ref 11.5–15.5)
WBC: 4.8 10*3/uL (ref 4.0–10.5)

## 2015-05-30 LAB — WET PREP, GENITAL
Sperm: NONE SEEN
Trich, Wet Prep: NONE SEEN

## 2015-05-30 LAB — URINE MICROSCOPIC-ADD ON

## 2015-05-30 LAB — URINALYSIS, ROUTINE W REFLEX MICROSCOPIC
BILIRUBIN URINE: NEGATIVE
Glucose, UA: NEGATIVE mg/dL
Hgb urine dipstick: NEGATIVE
KETONES UR: NEGATIVE mg/dL
LEUKOCYTES UA: NEGATIVE
NITRITE: NEGATIVE
PROTEIN: 100 mg/dL — AB
Specific Gravity, Urine: 1.028 (ref 1.005–1.030)
pH: 5 (ref 5.0–8.0)

## 2015-05-30 LAB — PREGNANCY, URINE: PREG TEST UR: NEGATIVE

## 2015-05-30 LAB — LIPASE, BLOOD: Lipase: 31 U/L (ref 11–51)

## 2015-05-30 MED ORDER — OXYCODONE-ACETAMINOPHEN 5-325 MG PO TABS: 1.0000 | ORAL_TABLET | Freq: Four times a day (QID) | ORAL | Status: DC | PRN

## 2015-05-30 MED ORDER — OXYCODONE-ACETAMINOPHEN 5-325 MG PO TABS
1.0000 | ORAL_TABLET | Freq: Once | ORAL | Status: AC
  Administered 2015-05-30: 1 via ORAL
  Filled 2015-05-30: qty 1

## 2015-05-30 MED ORDER — KETOROLAC TROMETHAMINE 30 MG/ML IJ SOLN
30.0000 mg | Freq: Once | INTRAMUSCULAR | Status: AC
  Administered 2015-05-30: 30 mg via INTRAVENOUS
  Filled 2015-05-30: qty 1

## 2015-05-30 MED ORDER — DOXYCYCLINE HYCLATE 100 MG PO CAPS: 100.0000 mg | ORAL_CAPSULE | Freq: Two times a day (BID) | ORAL | Status: DC

## 2015-05-30 MED ORDER — SODIUM CHLORIDE 0.9 % IV SOLN
1000.0000 mL | Freq: Once | INTRAVENOUS | Status: AC
  Administered 2015-05-30: 1000 mL via INTRAVENOUS

## 2015-05-30 MED ORDER — METRONIDAZOLE 500 MG PO TABS: 500.0000 mg | ORAL_TABLET | Freq: Two times a day (BID) | ORAL | Status: AC

## 2015-05-30 MED ORDER — LIDOCAINE HCL (PF) 1 % IJ SOLN
5.0000 mL | Freq: Once | INTRAMUSCULAR | Status: AC
Start: 2015-05-30 — End: 2015-05-30
  Administered 2015-05-30: 10 mL via INTRADERMAL
  Filled 2015-05-30: qty 5

## 2015-05-30 MED ORDER — CEFTRIAXONE SODIUM 250 MG IJ SOLR
250.0000 mg | Freq: Once | INTRAMUSCULAR | Status: AC
  Administered 2015-05-30: 250 mg via INTRAMUSCULAR
  Filled 2015-05-30: qty 250

## 2015-05-30 MED ORDER — FLUCONAZOLE 150 MG PO TABS: 150.0000 mg | ORAL_TABLET | Freq: Every day | ORAL | Status: AC

## 2015-05-30 MED ORDER — SODIUM CHLORIDE 0.9 % IV SOLN
1000.0000 mL | INTRAVENOUS | Status: DC
  Administered 2015-05-30: 1000 mL via INTRAVENOUS

## 2015-05-30 MED ORDER — ONDANSETRON HCL 4 MG PO TABS: 4.0000 mg | ORAL_TABLET | Freq: Three times a day (TID) | ORAL | Status: DC | PRN

## 2015-05-30 MED ORDER — FLUCONAZOLE 50 MG PO TABS
150.0000 mg | ORAL_TABLET | Freq: Once | ORAL | Status: AC
  Administered 2015-05-30: 150 mg via ORAL
  Filled 2015-05-30 (×2): qty 1

## 2015-05-30 MED ORDER — ONDANSETRON HCL 8 MG PO TABS
4.0000 mg | ORAL_TABLET | Freq: Once | ORAL | Status: AC
  Administered 2015-05-30: 4 mg via ORAL
  Filled 2015-05-30: qty 1

## 2015-05-30 NOTE — ED Provider Notes (Signed)
Pt seen and evaluated.  History of prior Bartholin's cyst abscesses requiring incision and drainage. Not symptomatically for a week. Exam shows obvious Bartholin's cyst abscess. Incision and drainage performed by myself, with PA. Word catheter placed after moderate to large amount of thick purulence drained via mucosal incision. Plan will be referral for definitive treatment with GYN. Continue her catheter. Pain medicines doxycycline.  [name], MD 05/30/15 1600

## 2015-05-30 NOTE — ED Provider Notes (Signed)
CSN: [id number]     Arrival date & time 05/30/15  1033 History   First MD Initiated Contact with Patient 05/30/15 1204     Chief Complaint  Patient presents with  . Rash  . Vaginal Discharge     (Consider location/radiation/quality/duration/timing/severity/associated sxs/prior Treatment) HPI   Patient seen a couple of months ago and diagnosed with PID, no insurance, so never took medications Today complaining of ongoing, persistent, white, "fishy" odor vaginal discharge x 3 months. Also complaining of Bartholin cyst.  She reports she has had it drained 3 times in the past year, and it has come back again.  Noticed it about 1 month ago.  Getting larger, and becoming more painful.  Nothing makes it better or worse.  Nothing tried PTA. Denies vaginal burning, itching, abdominal pain, fevers, chills, or vaginal bleeding. Endorses intermittent N/V, back pain, dizziness, and fatigue  Past Medical History  Diagnosis Date  . Asthma   . Pneumonia   . Fatigue   . Anemia   . Anxiety   . Depression     No meds. currently. Pt. stopped taking  . Loss of appetite    Past Surgical History  Procedure Laterality Date  . Cesarean section    . Tubal ligation     History reviewed. No pertinent family history. Social History  Substance Use Topics  . Smoking status: Current Every Day Smoker -- 1.00 packs/day    Types: Cigarettes  . Smokeless tobacco: Never Used  . Alcohol Use: 8.4 oz/week    14 Cans of beer per week     Comment: 2 (40oz) bottles per day   OB History    Gravida Para Term Preterm AB TAB SAB Ectopic Multiple Living   Review of Systems All other systems negative unless otherwise stated in HPI    Allergies  Diflucan; Percocet; Tramadol; and Ultram  Home Medications   Prior to Admission medications   Medication Sig Start Date End Date Taking? Authorizing Provider  doxycycline (VIBRAMYCIN) 100 MG capsule Take 1 capsule (100 mg total) by mouth 2  (two) times daily. 05/30/15   Cheri Fowler, PA-C  fluconazole (DIFLUCAN) 150 MG tablet Take 1 tablet (150 mg total) by mouth daily. Take one tablet after you finish your course of antibiotics 05/30/15 06/06/15  Cheri Fowler, PA-C  metroNIDAZOLE (FLAGYL) 500 MG tablet Take 1 tablet (500 mg total) by mouth 2 (two) times daily. 05/30/15   Cheri Fowler, PA-C  ondansetron (ZOFRAN) 4 MG tablet Take 1 tablet (4 mg total) by mouth every 8 (eight) hours as needed for nausea or vomiting. 05/30/15   Cheri Fowler, PA-C  oxyCODONE-acetaminophen (PERCOCET/ROXICET) 5-325 MG tablet Take 1 tablet by mouth every 6 (six) hours as needed for severe pain. 05/30/15   Kamilla Hands, PA-C   BP 118/83 mmHg  Pulse 73  Temp(Src) 98.8 F (37.1 C) (Oral)  Resp 18  SpO2 100%  LMP 05/13/2015 Physical Exam  Constitutional: She is oriented to person, place, and time. She appears well-developed and well-nourished.  HENT:  Head: Normocephalic and atraumatic.  Mouth/Throat: Oropharynx is clear and moist.  Eyes: Conjunctivae are normal. Pupils are equal, round, and reactive to light.  Neck: Normal range of motion. Neck supple.  Cardiovascular: Normal rate, regular rhythm and normal heart sounds.   No murmur heard. Pulmonary/Chest: Effort normal and breath sounds normal. No accessory muscle usage or stridor. No respiratory distress. She has no  wheezes. She has no rhonchi. She has no rales.  Abdominal: Soft. Bowel sounds are normal. She exhibits no distension. There is tenderness in the suprapubic area. There is no rigidity, no rebound and no guarding.  Genitourinary: Uterus normal.    There is no tenderness on the right labia. There is tenderness on the left labia. Cervix exhibits motion tenderness and discharge. Right adnexum displays tenderness. Left adnexum displays tenderness. There is tenderness in the vagina. Vaginal discharge found.  Musculoskeletal: Normal range of motion.  Lymphadenopathy:    She has no cervical adenopathy.    Neurological: She is alert and oriented to person, place, and time.  Speech clear without dysarthria.  Skin: Skin is warm and dry.  Psychiatric: She has a normal mood and affect. Her behavior is normal.    ED Course  Procedures (including critical care time)  INCISION AND DRAINAGE Performed by: Cheri Fowler and Dr. Fayrene Fearing Consent: Verbal consent obtained. Risks and benefits: risks, benefits and alternatives were discussed Type: abscess  Body area: left labia  Anesthesia: local infiltration  Incision was made with a scalpel.  Local anesthetic: lidocaine 1% without epinephrine  Anesthetic total: 6 ml  Complexity: complex Blunt dissection to break up loculations  Drainage: purulent  Drainage amount: 3 mL  Packing material: Word catheter  Patient tolerance: Patient tolerated the procedure well with no immediate complications.    Labs Review Labs Reviewed  WET PREP, GENITAL - Abnormal; Notable for the following:    Yeast Wet Prep HPF POC PRESENT (*)    Clue Cells Wet Prep HPF POC PRESENT (*)    WBC, Wet Prep HPF POC MODERATE (*)    All other components within normal limits  URINALYSIS, ROUTINE W REFLEX MICROSCOPIC (NOT AT [facility]) - Abnormal; Notable for the following:    APPearance CLOUDY (*)    Protein, ur 100 (*)    All other components within normal limits  URINE MICROSCOPIC-ADD ON - Abnormal; Notable for the following:    Squamous Epithelial / LPF 0-5 (*)    Bacteria, UA FEW (*)    All other components within normal limits  COMPREHENSIVE METABOLIC PANEL - Abnormal; Notable for the following:    Total Protein 8.3 (*)    ALT 13 (*)    All other components within normal limits  CBC WITH DIFFERENTIAL/PLATELET - Abnormal; Notable for the following:    Hemoglobin 8.2 (*)    HCT 28.0 (*)    MCV 64.2 (*)    MCH 18.8 (*)    MCHC 29.3 (*)    RDW 19.6 (*)    All other components within normal limits  PREGNANCY, URINE  LIPASE, BLOOD  HIV ANTIBODY (ROUTINE TESTING)   RPR  GC/CHLAMYDIA PROBE AMP ([place]) NOT AT [facility]    Imaging Review US Transvaginal Non-ob  05/30/2015  CLINICAL DATA:  UNtreated UTI/vaginal infection/scabies from 03/31/2015. Bartholin cyst. Mid pelvic pain and back pain. Nausea and vomiting. Four previous C-sections. EXAM: TRANSABDOMINAL AND TRANSVAGINAL ULTRASOUND OF PELVIS TECHNIQUE: Both transabdominal and transvaginal ultrasound examinations of the pelvis were performed. Transabdominal technique was performed for global imaging of the pelvis including uterus, ovaries, adnexal regions, and pelvic cul-de-sac. It was necessary to proceed with endovaginal exam following the transabdominal exam to visualize the endometrium and ovaries. COMPARISON:  CT 11/14/2014 and pelvic ultrasound 11/14/2014 FINDINGS: Uterus Measurements: 4.8 x 5.7 x 9.0 cm. No fibroids or other mass visualized. Endometrium Thickness: Approximately 10 mm.  Tiny amount of endometrial fluid. Right ovary Measurements: 2.3  x 2.4 x 2.9 cm. Normal appearance/no adnexal mass. Normal color flow. Left ovary Measurements: 2.4 x 2.2 x 4.4 cm. Normal appearance/no adnexal mass. Normal color flow. Other findings Trace free fluid. IMPRESSION: Tiny amount of endometrial fluid, otherwise unremarkable pelvic ultrasound. Electronically Signed   By: Elberta Fortisaniel  Boyle M.D.   On: 05/30/2015 14:04   [place] Pelvis Complete  05/30/2015  CLINICAL DATA:  UNtreated UTI/vaginal infection/scabies from 03/31/2015. Bartholin cyst. Mid pelvic pain and back pain. Nausea and vomiting. Four previous C-sections. EXAM: TRANSABDOMINAL AND TRANSVAGINAL ULTRASOUND OF PELVIS TECHNIQUE: Both transabdominal and transvaginal ultrasound examinations of the pelvis were performed. Transabdominal technique was performed for global imaging of the pelvis including uterus, ovaries, adnexal regions, and pelvic cul-de-sac. It was necessary to proceed with endovaginal exam following the transabdominal exam to visualize the endometrium and  ovaries. COMPARISON:  CT 11/14/2014 and pelvic ultrasound 11/14/2014 FINDINGS: Uterus Measurements: 4.8 x 5.7 x 9.0 cm. No fibroids or other mass visualized. Endometrium Thickness: Approximately 10 mm.  Tiny amount of endometrial fluid. Right ovary Measurements: 2.3 x 2.4 x 2.9 cm. Normal appearance/no adnexal mass. Normal color flow. Left ovary Measurements: 2.4 x 2.2 x 4.4 cm. Normal appearance/no adnexal mass. Normal color flow. Other findings Trace free fluid. IMPRESSION: Tiny amount of endometrial fluid, otherwise unremarkable pelvic ultrasound. Electronically Signed   By: Elberta Fortisaniel  Boyle M.D.   On: 05/30/2015 14:04   I have personally reviewed and evaluated these images and lab results as part of my medical decision-making.   EKG Interpretation None      MDM   Final diagnoses:  PID (acute pelvic inflammatory disease)  Yeast infection of the vagina  Bartholin's gland abscess    Patient presents with known diagnosis of PID.  No follow up on outpatient medications.  VSS, NAD, non-toxic appearing.  Heart RRR, lungs CTAB, abdomen is soft, tenderness in suprapubic region.  GU exam, CMT tenderness, adnexal tenderness, white cervical discharge.  Will obtain CBC, lipase, CMP, UA, urine pregnancy, wet prep, HIV, RPR, GC/chlamydia, and pelvic ultrasound.  Concern for TOA vs UTI vs PID.  CMP and lipase unremarkable CBC shows hgb 8.2, appears to be baseline, improvement from 6.8 4 months ago.  No leukocytosis UA negative for hgb, nitrite, WBCs. Doubt UTI. Wet prep shows yeast, WBC, and clue cells Pelvic and transvaginal U/S show no evidence of TOA or adnexal mass. Suspect PID.  Evaluation does not show pathology requring ongoing emergent intervention or admission. Pt is hemodynamically stable and mentating appropriately. Discussed findings/results and plan with patient/guardian, who agrees with plan. All questions answered. Return precautions discussed and outpatient follow up given.   Case has  been discussed with and seen by Dr. Fayrene FearingJames who agrees with the above plan for discharge.   Cheri FowlerKayla Evvie Behrmann, PA-C 05/30/15 2158  Rolland PorterMark James, MD 06/07/15 [phone]

## 2015-05-30 NOTE — ED Notes (Signed)
PA at bedside for pelvic with Chaperone

## 2015-05-30 NOTE — Discharge Instructions (Signed)
Pelvic Inflammatory Disease °Pelvic inflammatory disease (PID) refers to an infection in some or all of the female organs. The infection can be in the uterus, ovaries, fallopian tubes, or the surrounding tissues in the pelvis. PID can cause abdominal or pelvic pain that comes on suddenly (acute pelvic pain). PID is a serious infection because it can lead to lasting (chronic) pelvic pain or the inability to have children (infertility). °CAUSES °This condition is most often caused by an infection that is spread during sexual contact. However, the infection can also be caused by the normal bacteria that are found in the vaginal tissues if these bacteria travel upward into the reproductive organs. PID can also occur following: °· The birth of a baby. °· A miscarriage. °· An abortion. °· Major pelvic surgery. °· The use of an intrauterine device (IUD). °· A sexual assault. °RISK FACTORS °This condition is more likely to develop in women who: °· Are younger than 31 years of age. °· Are sexually active at a young age. °· Use nonbarrier contraception. °· Have multiple sexual partners. °· Have sex with someone who has symptoms of an STD (sexually transmitted disease). °· Use oral contraception. °At times, certain behaviors can also increase the possibility of getting PID, such as: °· Using a vaginal douche. °· Having an IUD in place. °SYMPTOMS °Symptoms of this condition include: °· Abdominal or pelvic pain. °· Fever. °· Chills. °· Abnormal vaginal discharge. °· Abnormal uterine bleeding. °· Unusual pain shortly after the end of a menstrual period. °· Painful urination. °· Pain with sexual intercourse. °· Nausea and vomiting. °DIAGNOSIS °To diagnose this condition, your health care provider will do a physical exam and take your medical history. A pelvic exam typically reveals great tenderness in the uterus and the surrounding pelvic tissues. You may also have tests, such as: °· Lab tests, including a pregnancy test, blood  tests, and urine test. °· Culture tests of the vagina and cervix to check for an STD. °· Ultrasound. °· A laparoscopic procedure to look inside the pelvis. °· Examining vaginal secretions under a microscope. °TREATMENT °Treatment for this condition may involve one or more approaches. °· Antibiotic medicines may be prescribed to be taken by mouth. °· Sexual partners may need to be treated if the infection is caused by an STD. °· For more severe cases, hospitalization may be needed to give antibiotics directly into a vein through an IV tube. °· Surgery may be needed if other treatments do not help, but this is rare. °It may take weeks until you are completely well. If you are diagnosed with PID, you should also be checked for human immunodeficiency virus (HIV). Your health care provider may test you for infection again 3 months after treatment. You should not have unprotected sex. °HOME CARE INSTRUCTIONS °· Take over-the-counter and prescription medicines only as told by your health care provider. °· If you were prescribed an antibiotic medicine, take it as told by your health care provider. Do not stop taking the antibiotic even if you start to feel better. °· Do not have sexual intercourse until treatment is completed or as told by your health care provider. If PID is confirmed, your recent sexual partners will need treatment, especially if you had unprotected sex. °· Keep all follow-up visits as told by your health care provider. This is important. °SEEK MEDICAL CARE IF: °· You have increased or abnormal vaginal discharge. °· Your pain does not improve. °· You vomit. °· You have a fever. °· You   cannot tolerate your medicines.  Your partner has an STD.  You have pain when you urinate. SEEK IMMEDIATE MEDICAL CARE IF:  You have increased abdominal or pelvic pain.  You have chills.  Your symptoms are not better in 72 hours even with treatment.   This information is not intended to replace advice given to  you by your health care provider. Make sure you discuss any questions you have with your health care provider.   Document Released: 07/01/2005 Document Revised: 03/22/2015 Document Reviewed: 08/08/2014 Elsevier Interactive Patient Education 2016 Else[employer] Inc[employer]  Bartholin Cyst or Abscess A Bartholin cyst is a fluid-filled sac that forms on a Bartholin gland. Bartholin glands are small glands that are found in the folds of skin (labia) on the sides of the lower opening of the vagina. This type of cyst causes a bulge on the side of the vagina. A cyst that is not large or infected may not cause problems. However, if the fluid in the cyst becomes infected, the cyst can turn into an abscess. An abscess may cause discomfort or pain. HOME CARE  Take medicines only as told by your doctor.  If you were prescribed an antibiotic medicine, finish all of it even if you start to feel better.  Apply warm, wet compresses to the area or take warm, shallow baths that cover your pelvic area (sitz baths). Do this many times each day or as told by your doctor.  Do not squeeze the cyst. Do not apply heavy pressure to it.  Do not have sex until the cyst has gone away.  If your cyst or abscess was opened by your doctor, a small piece of gauze or a drain may have been placed in the area. That lets the cyst drain. Do not remove the gauze or the drain until your doctor tells you it is okay to do that.  Do not wear tampons. Wear feminine pads as needed for any fluid or blood.  Keep all follow-up visits as told by your doctor. This is important. GET HELP IF:  Your pain, puffiness (swelling), or redness in the area of the cyst gets worse.  You have fluid or pus pus coming from the cyst.  You have a fever.   This information is not intended to replace advice given to you by your health care provider. Make sure you discuss any questions you have with your health care provider.   Document Released: 09/27/2008  Document Revised: 11/15/2014 Document Reviewed: 02/14/2014 Elsevier Interactive Patient Education 2016 Else[employer] Inc.  Monilial Vaginitis Vaginitis in a soreness, swelling and redness (inflammation) of the vagina and vulva. Monilial vaginitis is not a sexually transmitted infection. CAUSES  Yeast vaginitis is caused by yeast (candida) that is normally found in your vagina. With a yeast infection, the candida has overgrown in number to a point that upsets the chemical balance. SYMPTOMS   White, thick vaginal discharge.  Swelling, itching, redness and irritation of the vagina and possibly the lips of the vagina (vulva).  Burning or painful urination.  Painful intercourse. DIAGNOSIS  Things that may contribute to monilial vaginitis are:  Postmenopausal and virginal states.  Pregnancy.  Infections.  Being tired, sick or stressed, especially if you had monilial vaginitis in the past.  Diabetes. Good control will help lower the chance.  Birth control pills.  Tight fitting garments.  Using bubble bath, feminine sprays, douches or deodorant tampons.  Taking certain medications that kill germs (antibiotics).  Sporadic recurrence can occur if you  become ill. TREATMENT  Your caregiver will give you medication.  There are several kinds of anti monilial vaginal creams and suppositories specific for monilial vaginitis. For recurrent yeast infections, use a suppository or cream in the vagina 2 times a week, or as directed.  Anti-monilial or steroid cream for the itching or irritation of the vulva may also be used. Get your caregiver's permission.  Painting the vagina with methylene blue solution may help if the monilial cream does not work.  Eating yogurt may help prevent monilial vaginitis. HOME CARE INSTRUCTIONS   Finish all medication as prescribed.  Do not have sex until treatment is completed or after your caregiver tells you it is okay.  Take warm sitz baths.  Do not  douche.  Do not use tampons, especially scented ones.  Wear cotton underwear.  Avoid tight pants and panty hose.  Tell your sexual partner that you have a yeast infection. They should go to their caregiver if they have symptoms such as mild rash or itching.  Your sexual partner should be treated as well if your infection is difficult to eliminate.  Practice safer sex. Use condoms.  Some vaginal medications cause latex condoms to fail. Vaginal medications that harm condoms are:  Cleocin cream.  Butoconazole (Femstat).  Terconazole (Terazol) vaginal suppository.  Miconazole (Monistat) (may be purchased over the counter). SEEK MEDICAL CARE IF:   You have a temperature by mouth above 102 F (38.9 C).  The infection is getting worse after 2 days of treatment.  The infection is not getting better after 3 days of treatment.  You develop blisters in or around your vagina.  You develop vaginal bleeding, and it is not your menstrual period.  You have pain when you urinate.  You develop intestinal problems.  You have pain with sexual intercourse.   This information is not intended to replace advice given to you by your health care provider. Make sure you discuss any questions you have with your health care provider.   Document Released: 04/10/2005 Document Revised: 09/23/2011 Document Reviewed: 01/02/2015 Elsevier Interactive Patient Education [employer]2016 Elsevier Inc.

## 2015-05-30 NOTE — ED Notes (Signed)
Pt amb to triage with quick steady gait in nad. Pt reports being seen here 2 weeks ago, dx with "uti, scabies and a bacterial infection in my vagina". Pt states she has no insurance, so did not fill any of her meds, and cont with rash/itching, and vag d/c.

## 2015-05-31 LAB — HIV ANTIBODY (ROUTINE TESTING W REFLEX): HIV SCREEN 4TH GENERATION: NONREACTIVE

## 2015-05-31 LAB — GC/CHLAMYDIA PROBE AMP (~~LOC~~) NOT AT ARMC
CHLAMYDIA, DNA PROBE: NEGATIVE
Neisseria Gonorrhea: NEGATIVE

## 2015-05-31 LAB — RPR: RPR: NONREACTIVE

## 2015-11-24 ENCOUNTER — Emergency Department (HOSPITAL_BASED_OUTPATIENT_CLINIC_OR_DEPARTMENT_OTHER)
Admission: EM | Admit: 2015-11-24 | Discharge: 2015-11-24 | Disposition: A | Discharge status: Home / Self Care | Payer: Medicaid Other | Attending: Emergency Medicine | Admitting: Emergency Medicine

## 2015-11-24 ENCOUNTER — Emergency Department (HOSPITAL_BASED_OUTPATIENT_CLINIC_OR_DEPARTMENT_OTHER): Payer: Medicaid Other

## 2015-11-24 ENCOUNTER — Encounter (HOSPITAL_BASED_OUTPATIENT_CLINIC_OR_DEPARTMENT_OTHER): Payer: Self-pay | Admitting: Emergency Medicine

## 2015-11-24 DIAGNOSIS — F17210 Nicotine dependence, cigarettes, uncomplicated: Secondary | ICD-10-CM | POA: Diagnosis not present

## 2015-11-24 DIAGNOSIS — R079 Chest pain, unspecified: Secondary | ICD-10-CM

## 2015-11-24 DIAGNOSIS — F329 Major depressive disorder, single episode, unspecified: Secondary | ICD-10-CM | POA: Diagnosis not present

## 2015-11-24 DIAGNOSIS — Z792 Long term (current) use of antibiotics: Secondary | ICD-10-CM | POA: Insufficient documentation

## 2015-11-24 DIAGNOSIS — R0789 Other chest pain: Secondary | ICD-10-CM | POA: Insufficient documentation

## 2015-11-24 DIAGNOSIS — J45909 Unspecified asthma, uncomplicated: Secondary | ICD-10-CM | POA: Diagnosis not present

## 2015-11-24 LAB — CBC WITH DIFFERENTIAL/PLATELET
BASOS ABS: 0 10*3/uL (ref 0.0–0.1)
Basophils Relative: 1 %
EOS PCT: 2 %
Eosinophils Absolute: 0.1 10*3/uL (ref 0.0–0.7)
HCT: 29.9 % — ABNORMAL LOW (ref 36.0–46.0)
Hemoglobin: 9.4 g/dL — ABNORMAL LOW (ref 12.0–15.0)
LYMPHS ABS: 1.4 10*3/uL (ref 0.7–4.0)
LYMPHS PCT: 30 %
MCH: 20.8 pg — AB (ref 26.0–34.0)
MCHC: 31.4 g/dL (ref 30.0–36.0)
MCV: 66.3 fL — ABNORMAL LOW (ref 78.0–100.0)
MONOS PCT: 13 %
Monocytes Absolute: 0.6 10*3/uL (ref 0.1–1.0)
Neutro Abs: 2.5 10*3/uL (ref 1.7–7.7)
Neutrophils Relative %: 54 %
PLATELETS: 351 10*3/uL (ref 150–400)
RBC: 4.51 MIL/uL (ref 3.87–5.11)
RDW: 22.6 % — ABNORMAL HIGH (ref 11.5–15.5)
WBC: 4.6 10*3/uL (ref 4.0–10.5)

## 2015-11-24 LAB — BASIC METABOLIC PANEL
Anion gap: 5 (ref 5–15)
BUN: 11 mg/dL (ref 6–20)
CO2: 25 mmol/L (ref 22–32)
CREATININE: 0.76 mg/dL (ref 0.44–1.00)
Calcium: 8.6 mg/dL — ABNORMAL LOW (ref 8.9–10.3)
Chloride: 105 mmol/L (ref 101–111)
Glucose, Bld: 97 mg/dL (ref 65–99)
POTASSIUM: 3.4 mmol/L — AB (ref 3.5–5.1)
SODIUM: 135 mmol/L (ref 135–145)

## 2015-11-24 LAB — TROPONIN I

## 2015-11-24 MED ORDER — SODIUM CHLORIDE 0.9 % IV BOLUS (SEPSIS)
1000.0000 mL | Freq: Once | INTRAVENOUS | Status: AC
  Administered 2015-11-24: 1000 mL via INTRAVENOUS

## 2015-11-24 MED ORDER — POTASSIUM CHLORIDE CRYS ER 20 MEQ PO TBCR
20.0000 meq | EXTENDED_RELEASE_TABLET | Freq: Once | ORAL | Status: AC
  Administered 2015-11-24: 20 meq via ORAL
  Filled 2015-11-24: qty 1

## 2015-11-24 NOTE — Discharge Instructions (Signed)

## 2015-11-24 NOTE — ED Notes (Signed)
PA at bedside.

## 2015-11-24 NOTE — ED Notes (Signed)
Pt reports history of recurrent chest pain x 3 days with central chest pain, no radiation of pain, pt states she is anemic and last hgb was 8

## 2015-11-24 NOTE — ED Notes (Signed)
Pt reports having numbness in fingers and toes x 3 days. Also reports some shortness of breath and "feeling dehydrated, no matter what I drink."

## 2015-11-24 NOTE — ED Provider Notes (Signed)
CSN: [id number]     Arrival date & time 11/24/15  1012 History   First MD Initiated Contact with Patient 11/24/15 1027     Chief Complaint  Patient presents with  . Chest Pain   HPI  [name] is a 30 year old female with PMHx of asthma and anxiety presenting with chest pain and extremity numbness. Pt reports onset of symptoms 3 days ago. She complains of intermittent central chest pain that is described as stabbing. The pain worsens with deep inspiration and palpation. Denies chest pain on exertion. She reports a history of anemia and states that the last time she had chest pain it was because her Hgb was low. Reports needing blood transfusion in July 2016. She takes daily iron supplements. Denies associated diaphoresis, dizziness, syncope, SOB, nausea or vomiting. She does note that she gets SOB when walking long distances but states this is not a new symptom and she has always had this. She also reports fingers and toes tingling and numbness intermittently over the past 3 days. Denies exacerbating or alleviating factors. Denies associated weakness, color change or temperature change. She is concerned that she is dehydrated because "I've been drinking a lot of juice over the past few days but still feel thirsty". She has not tried any OTC medications. Denies personal cardiac history or early deaths in the family. Denies fevers, chills, headache, URI symptoms, palpitations, back pain, abdominal pain. Denies exogenous hormones, recent immobility or long travel, leg swelling or personal/family history of blood clots.   Past Medical History  Diagnosis Date  . Asthma   . Pneumonia   . Fatigue   . Anemia   . Anxiety   . Depression     No meds. currently. Pt. stopped taking  . Loss of appetite    Past Surgical History  Procedure Laterality Date  . Cesarean section    . Tubal ligation     History reviewed. No pertinent family history. Social History  Substance Use Topics  . Smoking status:  Current Every Day Smoker -- 1.00 packs/day    Types: Cigarettes  . Smokeless tobacco: Never Used  . Alcohol Use: 8.4 oz/week    14 Cans of beer per week     Comment: 2 (40oz) bottles per day   OB History    Gravida Para Term Preterm AB TAB SAB Ectopic Multiple Living   6 5 5  1  1  1 5      Review of Systems  All other systems reviewed and are negative.     Allergies  Diflucan; Percocet; Tramadol; and Ultram  Home Medications   Prior to Admission medications   Medication Sig Start Date End Date Taking? Authorizing Provider  metroNIDAZOLE (FLAGYL) 500 MG tablet Take 1 tablet (500 mg total) by mouth 2 (two) times daily. 05/30/15  Yes Kayla Rose, PA-C   BP 121/77 mmHg  Pulse 82  Temp(Src) 98.8 F (37.1 C)  Resp 20  Ht 5\' 7"  (1.702 m)  Wt 81.647 kg  BMI 28.19 kg/m2  SpO2 100% Physical Exam  Constitutional: She appears well-developed and well-nourished. No distress.  Nontoxic appearing. NAD  HENT:  Head: Normocephalic and atraumatic.  Eyes: Conjunctivae are normal. Right eye exhibits no discharge. Left eye exhibits no discharge. No scleral icterus.  Neck: Normal range of motion.  Cardiovascular: Normal rate, regular rhythm and normal heart sounds.   Pulmonary/Chest: Effort normal and breath sounds normal. No respiratory distress. She has no wheezes. She has no rales.  She exhibits tenderness.  TTP over anterior chest wall. No underlying bony deformities  Abdominal: Soft. She exhibits no distension. There is no tenderness.  Musculoskeletal: Normal range of motion.  Moves all extremities spontaneously. FROM of fingers and toes. All joints are supple without swelling or tenderness. No unilateral calf swelling or focal TTP  Neurological: She is alert. Coordination normal.  5/5 strength of BUE and BLE. Sensation to light touch intact over the extremities.   Skin: Skin is warm and dry.  No color change noted to fingers or toes  Psychiatric: She has a normal mood and affect.  Her behavior is normal.  Nursing note and vitals reviewed.   ED Course  Procedures (including critical care time) Labs Review Labs Reviewed  CBC WITH DIFFERENTIAL/PLATELET - Abnormal; Notable for the following:    Hemoglobin 9.4 (*)    HCT 29.9 (*)    MCV 66.3 (*)    MCH 20.8 (*)    RDW 22.6 (*)    All other components within normal limits  BASIC METABOLIC PANEL - Abnormal; Notable for the following:    Potassium 3.4 (*)    Calcium 8.6 (*)    All other components within normal limits  TROPONIN I    Imaging Review Dg Chest 2 View  11/24/2015  CLINICAL DATA:  Chest pain for 3 days EXAM: CHEST  2 VIEW COMPARISON:  January 31, 2015 FINDINGS: Lungs are clear. Heart size and pulmonary vascularity are normal. No adenopathy. No pneumothorax. No bone lesions. IMPRESSION: No edema or consolidation. Electronically Signed   By: Bretta Bang III M.D.   On: 11/24/2015 11:05   I have personally reviewed and evaluated these images and lab results as part of my medical decision-making.   EKG Interpretation   Date/Time:  Friday Nov 24 2015 10:21:06 EDT Ventricular Rate:  89 PR Interval:  146 QRS Duration: 91 QT Interval:  360 QTC Calculation: 438 R Axis:   85 Text Interpretation:  Sinus rhythm since last tracing no significant  change Confirmed by BELFI  MD, MELANIE ([id number]) on 11/24/2015 10:26:11 AM      MDM   Final diagnoses:  Chest pain, unspecified chest pain type   Patient presenting with atypical chest pain and digit numbness. Patient is to be discharged with recommendation to follow up with PCP in regards to today's hospital visit. VSS. Heart RRR without murmur. Lungs CTAB. Extremities are neurovascularly intact with FROM and no sensory deficit. Troponin negative with non-iscehmic ECG. CXR without abnormality. PERC negative. Hgb 9.4 which is above her typical baseline. Potassium slightly low which was repleted in ED. Presentation and workup is not consistent with cardiac or  pulmonary pain origin. Pt's Hgb is stable. Recommend close follow up with PCP.  At this time there does not appear to be any evidence of an acute emergency medical condition and the patient appears stable for discharge with appropriate outpatient follow up. Diagnosis was discussed with patient who verbalizes understanding and is agreeable to discharge. Pt has been advised to return to the ED if CP becomes exertional, associated with diaphoresis or nausea, radiates to left jaw/arm, worsens or becomes concerning in any way. Pt case discussed with Dr. Fredderick Phenix who agrees with my plan. Return precautions given in discharge paperwork and discussed with pt at bedside. Pt is stable for discharge.     Rolm Gala Georgiann Neider, PA-C 11/24/15 1216  Rolan Bucco, MD 11/24/15 1238

## 2016-03-04 ENCOUNTER — Encounter (HOSPITAL_BASED_OUTPATIENT_CLINIC_OR_DEPARTMENT_OTHER): Payer: Self-pay | Admitting: Emergency Medicine

## 2016-03-04 ENCOUNTER — Emergency Department (HOSPITAL_BASED_OUTPATIENT_CLINIC_OR_DEPARTMENT_OTHER): Payer: Medicaid Other

## 2016-03-04 ENCOUNTER — Emergency Department (HOSPITAL_BASED_OUTPATIENT_CLINIC_OR_DEPARTMENT_OTHER)
Admission: EM | Admit: 2016-03-04 | Discharge: 2016-03-04 | Disposition: A | Discharge status: Home / Self Care | Payer: Medicaid Other | Attending: Emergency Medicine | Admitting: Emergency Medicine

## 2016-03-04 DIAGNOSIS — R197 Diarrhea, unspecified: Secondary | ICD-10-CM | POA: Insufficient documentation

## 2016-03-04 DIAGNOSIS — R072 Precordial pain: Secondary | ICD-10-CM | POA: Diagnosis present

## 2016-03-04 DIAGNOSIS — N751 Abscess of Bartholin's gland: Secondary | ICD-10-CM | POA: Insufficient documentation

## 2016-03-04 DIAGNOSIS — J45909 Unspecified asthma, uncomplicated: Secondary | ICD-10-CM | POA: Diagnosis not present

## 2016-03-04 DIAGNOSIS — F17210 Nicotine dependence, cigarettes, uncomplicated: Secondary | ICD-10-CM | POA: Diagnosis not present

## 2016-03-04 DIAGNOSIS — R091 Pleurisy: Secondary | ICD-10-CM | POA: Insufficient documentation

## 2016-03-04 DIAGNOSIS — R112 Nausea with vomiting, unspecified: Secondary | ICD-10-CM | POA: Diagnosis not present

## 2016-03-04 LAB — CBC
HEMATOCRIT: 34.7 % — AB (ref 36.0–46.0)
HEMOGLOBIN: 11.7 g/dL — AB (ref 12.0–15.0)
MCH: 27.4 pg (ref 26.0–34.0)
MCHC: 33.7 g/dL (ref 30.0–36.0)
MCV: 81.3 fL (ref 78.0–100.0)
Platelets: 255 10*3/uL (ref 150–400)
RBC: 4.27 MIL/uL (ref 3.87–5.11)
RDW: 21.4 % — ABNORMAL HIGH (ref 11.5–15.5)
WBC: 6.8 10*3/uL (ref 4.0–10.5)

## 2016-03-04 LAB — BASIC METABOLIC PANEL
ANION GAP: 8 (ref 5–15)
BUN: 8 mg/dL (ref 6–20)
CHLORIDE: 107 mmol/L (ref 101–111)
CO2: 24 mmol/L (ref 22–32)
Calcium: 8.4 mg/dL — ABNORMAL LOW (ref 8.9–10.3)
Creatinine, Ser: 0.72 mg/dL (ref 0.44–1.00)
GFR calc non Af Amer: >60 mL/min (ref >60)
GLUCOSE: 101 mg/dL — AB (ref 65–99)
Potassium: 3.6 mmol/L (ref 3.5–5.1)
Sodium: 139 mmol/L (ref 135–145)

## 2016-03-04 LAB — D-DIMER, QUANTITATIVE (NOT AT ARMC): D DIMER QUANT: 0.61 ug{FEU}/mL — AB (ref 0.00–0.50)

## 2016-03-04 LAB — TROPONIN I: Troponin I: <0.03 ng/mL (ref <0.03)

## 2016-03-04 MED ORDER — IOPAMIDOL (ISOVUE-370) INJECTION 76%
100.0000 mL | Freq: Once | INTRAVENOUS | Status: AC | PRN
  Administered 2016-03-04: 100 mL via INTRAVENOUS

## 2016-03-04 MED ORDER — FENTANYL CITRATE (PF) 100 MCG/2ML IJ SOLN
100.0000 ug | Freq: Once | INTRAMUSCULAR | Status: AC
  Administered 2016-03-04: 100 ug via INTRAVENOUS
  Filled 2016-03-04: qty 2

## 2016-03-04 MED ORDER — HYDROCODONE-ACETAMINOPHEN 5-325 MG PO TABS: 1.0000 | ORAL_TABLET | Freq: Four times a day (QID) | ORAL | 0 refills | Status: AC | PRN

## 2016-03-04 MED ORDER — LIDOCAINE-EPINEPHRINE (PF) 2 %-1:200000 IJ SOLN
10.0000 mL | Freq: Once | INTRAMUSCULAR | Status: DC
  Filled 2016-03-04: qty 10

## 2016-03-04 MED ORDER — SODIUM CHLORIDE 0.9 % IV BOLUS (SEPSIS)
1000.0000 mL | Freq: Once | INTRAVENOUS | Status: AC
Start: 2016-03-04 — End: 2016-03-04
  Administered 2016-03-04: 1000 mL via INTRAVENOUS

## 2016-03-04 MED ORDER — HYDROCODONE-ACETAMINOPHEN 5-325 MG PO TABS
1.0000 | ORAL_TABLET | Freq: Once | ORAL | Status: AC
  Administered 2016-03-04: 1 via ORAL
  Filled 2016-03-04: qty 1

## 2016-03-04 MED ORDER — SULFAMETHOXAZOLE-TRIMETHOPRIM 800-160 MG PO TABS: 1.0000 | ORAL_TABLET | Freq: Two times a day (BID) | ORAL | 0 refills | Status: AC

## 2016-03-04 MED ORDER — ONDANSETRON HCL 4 MG/2ML IJ SOLN
4.0000 mg | Freq: Once | INTRAMUSCULAR | Status: AC
  Administered 2016-03-04: 4 mg via INTRAVENOUS
  Filled 2016-03-04: qty 2

## 2016-03-04 NOTE — ED Provider Notes (Signed)
[facility]   CSN: [id number] Arrival date & time: 03/04/16  0256     History   Chief Complaint Chief Complaint  Patient presents with  . Chest Pain    HPI Rhonda Whitaker is a 30 y.o. female.  The history is provided by the patient.  Chest Pain   This is a new problem. The current episode started more than 2 days ago. The problem occurs daily. The problem has been gradually worsening. The pain is associated with coughing and breathing. The pain is present in the substernal region. The pain is moderate. The quality of the pain is described as sharp. Duration of episode(s) is 3 days. The symptoms are aggravated by deep breathing. Associated symptoms include cough, shortness of breath and vomiting. Pertinent negatives include no fever. Risk factors include smoking/tobacco exposure.  Pertinent negatives for past medical history include no CAD and no PE.  Patient presents for multiple complaints:   1. Chest pain for 3 days - she reports it is sharp and worse with breathing/coughing.  No fever.  She reports associated SOB.    2. She reports nonbloody diarrhea for months  3. She reports nonbloody vomiting since yesterday  4. She reports recurrent bartholin cyst vaginal area.  She reports "I've been cut like 5 times" but it keeps recurring    Past Medical History:  Diagnosis Date  . Anemia   . Anxiety   . Asthma   . Depression    No meds. currently. Pt. stopped taking  . Fatigue   . Loss of appetite   . Pneumonia     There are no active problems to display for this patient.   Past Surgical History:  Procedure Laterality Date  . CESAREAN SECTION    . TUBAL LIGATION      OB History    Gravida Para Term Preterm AB Living   6 5 5   1 5    SAB TAB Ectopic Multiple Live Births   1     1         Home Medications    Prior to Admission medications   Medication Sig Start Date End Date Taking? Authorizing Provider  metroNIDAZOLE (FLAGYL)  500 MG tablet Take 1 tablet (500 mg total) by mouth 2 (two) times daily. 05/30/15   Cheri Fowler, PA-C    Family History No family history on file.  Social History Social History  Substance Use Topics  . Smoking status: Current Every Day Smoker    Packs/day: 1.00    Types: Cigarettes  . Smokeless tobacco: Never Used  . Alcohol use 3.6 oz/week    6 Cans of beer per week     Comment: 2 (40oz) bottles per day     Allergies   Diflucan [fluconazole]; Percocet [oxycodone-acetaminophen]; Tramadol; and Ultram [tramadol hcl]   Review of Systems Review of Systems  Constitutional: Negative for fever.  Respiratory: Positive for cough and shortness of breath.   Cardiovascular: Positive for chest pain.  Gastrointestinal: Positive for diarrhea and vomiting. Negative for blood in stool.  Genitourinary: Positive for vaginal pain.  All other systems reviewed and are negative.    Physical Exam Updated Vital Signs BP (!) 108/53   Pulse 97   Temp 98.1 F (36.7 C) (Oral)   Resp 24   Ht 5\' 7"  (1.702 m)   Wt 83.9 kg   SpO2 100%   BMI 28.98 kg/m   Physical Exam  CONSTITUTIONAL: Well developed/well nourished HEAD: Normocephalic/atraumatic  EYES: EOMI/PERRL ENMT: Mucous membranes moist NECK: supple no meningeal signs SPINE/BACK:entire spine nontender CV: S1/S2 noted, no murmurs/rubs/gallops noted, significant tachycardia with any movement in bed.   LUNGS: scattered wheeze, mild tachypnea noted, no crackles noted ABDOMEN: soft, nontender, no rebound or guarding, bowel sounds noted throughout abdomen GU:no cva tenderness, small bartholin cyst noted to left lower external genitalia, no drainage noted, female chaperone present NEURO: Pt is awake/alert/appropriate, moves all extremitiesx4.  No facial droop.   EXTREMITIES: pulses normal/equal, full ROM, no LE edema or tenderness SKIN: warm, color normal PSYCH: no abnormalities of mood noted, alert and oriented to situation  ED Treatments  / Results  Labs (all labs ordered are listed, but only abnormal results are displayed) Labs Reviewed  BASIC METABOLIC PANEL - Abnormal; Notable for the following:       Result Value   Glucose, Bld 101 (*)    Calcium 8.4 (*)    All other components within normal limits  CBC - Abnormal; Notable for the following:    Hemoglobin 11.7 (*)    HCT 34.7 (*)    RDW 21.4 (*)    All other components within normal limits  TROPONIN I  D-DIMER, QUANTITATIVE (NOT AT [facility])    EKG  EKG Interpretation  Date/Time:  Monday March 04 2016 03:10:49 EDT Ventricular Rate:  103 PR Interval:    QRS Duration: 83 QT Interval:  335 QTC Calculation: 439 R Axis:   62 Text Interpretation:  Sinus tachycardia ECG OTHERWISE WITHIN NORMAL LIMITS No significant change since last tracing Confirmed by Bebe ShaggyWICKLINE  MD, Shayde Gervacio ([id number]) on 03/04/2016 3:22:39 AM       Radiology Dg Chest 2 View  Result Date: 03/04/2016 CLINICAL DATA:  Chest pain, worse with inspiration. History of asthma and tobacco use. EXAM: CHEST  2 VIEW COMPARISON:  Chest radiograph 11/24/2015 FINDINGS: Cardiomediastinal contours are normal. No pneumothorax or sizable pleural effusion. No focal airspace consolidation or pulmonary edema. IMPRESSION: Clear lungs. Electronically Signed   By: Deatra RobinsonKevin  Herman M.D.   On: 03/04/2016 04:30    Procedures Procedures  INCISION AND DRAINAGE Performed by: Joya GaskinsWICKLINE,Trayvon Trumbull W Consent: Verbal consent obtained. Risks and benefits: risks, benefits and alternatives were discussed Type: abscess  Body area: bartholin gland  Anesthesia: local infiltration  Incision was made with a scalpel.  Local anesthetic: lidocaine % with epinephrine  Anesthetic total: 5 ml  Complexity: complex Blunt dissection to break up loculations  Drainage: purulent  Drainage amount: moderate  Packing material:iodoform gauze  Patient tolerance: Patient tolerated the procedure well with no immediate complications. Female  chaperone present for exam     Medications Ordered in ED Medications  lidocaine-EPINEPHrine (XYLOCAINE W/EPI) 2 %-1:200000 (PF) injection 10 mL (not administered)  sodium chloride 0.9 % bolus 1,000 mL (1,000 mLs Intravenous New Bag/Given 03/04/16 0635)  fentaNYL (SUBLIMAZE) injection 100 mcg (100 mcg Intravenous Given 03/04/16 0635)  ondansetron (ZOFRAN) injection 4 mg (4 mg Intravenous Given 03/04/16 [id number])     Initial Impression / Assessment and Plan / ED Course  I have reviewed the triage vital signs and the nursing notes.  Pertinent labs & imaging results that were available during my care of the patient were reviewed by me and considered in my medical decision making (see chart for details).  Clinical Course    7:07 AM Patient with multiple complaints: For chest pain - patient with pleuritic CP and episodes of tachycardia (HR >115) and tachypnea.  D-dimer elevated = CT chest ordered to r/o PE  For  bartholin cyst - I&D performed.  Will send home on bactrim.  She has had multiple I&D previously.  Will defer word catheter  But will refer to her GYN This week as she may need more surgical repair due to multiple episodes  At signout to dr Ethelda Chickjacubowitz, f/u on CT chest   Final Clinical Impressions(s) / ED Diagnoses   Final diagnoses:  Pleurisy  Bartholin's gland abscess  Diarrhea, unspecified type  Non-intractable vomiting with nausea, vomiting of unspecified type    New Prescriptions New Prescriptions   HYDROCODONE-ACETAMINOPHEN (NORCO/VICODIN) 5-325 MG TABLET    Take 1 tablet by mouth every 6 (six) hours as needed for severe pain.   SULFAMETHOXAZOLE-TRIMETHOPRIM (BACTRIM DS,SEPTRA DS) 800-160 MG TABLET    Take 1 tablet by mouth 2 (two) times daily.     Zadie Rhineonald Deneise Getty, MD 03/04/16 [phone]

## 2016-03-04 NOTE — ED Notes (Signed)
MD at bedside discussing results with this patient.

## 2016-03-04 NOTE — ED Triage Notes (Signed)
Pt reports chest pain starting about 3 days ago, worse with inspiration. Hx of asthma, and smoking. Pt also states diarrhea x "a couple months" and a bartholin cyst.

## 2016-03-04 NOTE — ED Provider Notes (Signed)
Complains of pleuritic chest pain gradual onset 3 days ago. Pain is worse with taking deep breath. Patient appears in no distress. Chest x-ray viewed by me . I counseled patient for 5 minutes on smoking cessation Results for orders placed or performed during the hospital encounter of 03/04/16  Basic metabolic panel  Result Value Ref Range   Sodium 139 135 - 145 mmol/L   Potassium 3.6 3.5 - 5.1 mmol/L   Chloride 107 101 - 111 mmol/L   CO2 24 22 - 32 mmol/L   Glucose, Bld 101 (H) 65 - 99 mg/dL   BUN 8 6 - 20 mg/dL   Creatinine, Ser 1.610.72 0.44 - 1.00 mg/dL   Calcium 8.4 (L) 8.9 - 10.3 mg/dL   GFR calc non Af Amer >60 >60 mL/min   GFR calc Af Amer >60 >60 mL/min   Anion gap 8 5 - 15  CBC  Result Value Ref Range   WBC 6.8 4.0 - 10.5 K/uL   RBC 4.27 3.87 - 5.11 MIL/uL   Hemoglobin 11.7 (L) 12.0 - 15.0 g/dL   HCT 09.634.7 (L) 04.536.0 - 40.946.0 %   MCV 81.3 78.0 - 100.0 fL   MCH 27.4 26.0 - 34.0 pg   MCHC 33.7 30.0 - 36.0 g/dL   RDW 81.121.4 (H) 91.411.5 - 78.215.5 %   Platelets 255 150 - 400 K/uL  Troponin I  Result Value Ref Range   Troponin I <0.03 <0.03 ng/mL  D-dimer, quantitative (not at [facility])  Result Value Ref Range   D-Dimer, Quant 0.61 (H) 0.00 - 0.50 ug/mL-FEU   Dg Chest 2 View  Result Date: 03/04/2016 CLINICAL DATA:  Chest pain, worse with inspiration. History of asthma and tobacco use. EXAM: CHEST  2 VIEW COMPARISON:  Chest radiograph 11/24/2015 FINDINGS: Cardiomediastinal contours are normal. No pneumothorax or sizable pleural effusion. No focal airspace consolidation or pulmonary edema. IMPRESSION: Clear lungs. Electronically Signed   By: Deatra RobinsonKevin  Herman M.D.   On: 03/04/2016 04:30   Ct Angio Chest Pe W And/or Wo Contrast  Result Date: 03/04/2016 CLINICAL DATA:  Chest pain beginning 3 days ago, worse with inspiration. EXAM: CT ANGIOGRAPHY CHEST WITH CONTRAST TECHNIQUE: Multidetector CT imaging of the chest was performed using the standard protocol during bolus administration of intravenous  contrast. Multiplanar CT image reconstructions and MIPs were obtained to evaluate the vascular anatomy. CONTRAST:  100 cc Isovue 370 IV COMPARISON:  01/30/2015 FINDINGS: Cardiovascular: No filling defects in the pulmonary arteries to suggest pulmonary emboli. Heart is normal size. Aorta is normal caliber. Mediastinum/Nodes: No mediastinal, hilar, or axillary adenopathy. Lungs/Pleura: Lungs are clear. No focal airspace opacities or suspicious nodules. No effusions. Upper Abdomen: Imaging into the upper abdomen shows no acute findings. Musculoskeletal: No acute bony abnormality or focal bone lesion. Review of the MIP images confirms the above findings. IMPRESSION: No evidence of pulmonary embolus. No acute cardiopulmonary disease. Electronically Signed   By: Charlett NoseKevin  Dover M.D.   On: 03/04/2016 07:46     Doug SouSam Asya Derryberry, MD 03/04/16 [id number]

## 2016-05-11 ENCOUNTER — Emergency Department (HOSPITAL_BASED_OUTPATIENT_CLINIC_OR_DEPARTMENT_OTHER): Payer: Medicaid Other

## 2016-05-11 ENCOUNTER — Encounter (HOSPITAL_BASED_OUTPATIENT_CLINIC_OR_DEPARTMENT_OTHER): Payer: Self-pay | Admitting: Emergency Medicine

## 2016-05-11 ENCOUNTER — Emergency Department (HOSPITAL_BASED_OUTPATIENT_CLINIC_OR_DEPARTMENT_OTHER)
Admission: EM | Admit: 2016-05-11 | Discharge: 2016-05-11 | Disposition: A | Discharge status: Home / Self Care | Payer: Medicaid Other | Attending: Emergency Medicine | Admitting: Emergency Medicine

## 2016-05-11 DIAGNOSIS — J45909 Unspecified asthma, uncomplicated: Secondary | ICD-10-CM | POA: Insufficient documentation

## 2016-05-11 DIAGNOSIS — R0789 Other chest pain: Secondary | ICD-10-CM | POA: Diagnosis not present

## 2016-05-11 DIAGNOSIS — Z79899 Other long term (current) drug therapy: Secondary | ICD-10-CM | POA: Insufficient documentation

## 2016-05-11 DIAGNOSIS — F17210 Nicotine dependence, cigarettes, uncomplicated: Secondary | ICD-10-CM | POA: Insufficient documentation

## 2016-05-11 HISTORY — DX: Iron deficiency: E61.1

## 2016-05-11 LAB — CBC WITH DIFFERENTIAL/PLATELET
Basophils Absolute: 0 10*3/uL (ref 0.0–0.1)
Basophils Relative: 1 %
EOS ABS: 0.2 10*3/uL (ref 0.0–0.7)
EOS PCT: 2 %
HCT: 40.7 % (ref 36.0–46.0)
HEMOGLOBIN: 14.1 g/dL (ref 12.0–15.0)
LYMPHS ABS: 1.9 10*3/uL (ref 0.7–4.0)
Lymphocytes Relative: 24 %
MCH: 30.2 pg (ref 26.0–34.0)
MCHC: 34.6 g/dL (ref 30.0–36.0)
MCV: 87.2 fL (ref 78.0–100.0)
MONO ABS: 0.7 10*3/uL (ref 0.1–1.0)
MONOS PCT: 9 %
NEUTROS PCT: 64 %
Neutro Abs: 5.2 10*3/uL (ref 1.7–7.7)
Platelets: 270 10*3/uL (ref 150–400)
RBC: 4.67 MIL/uL (ref 3.87–5.11)
RDW: 15.9 % — AB (ref 11.5–15.5)
WBC: 8 10*3/uL (ref 4.0–10.5)

## 2016-05-11 LAB — BASIC METABOLIC PANEL
Anion gap: 7 (ref 5–15)
BUN: 9 mg/dL (ref 6–20)
CALCIUM: 9.2 mg/dL (ref 8.9–10.3)
CHLORIDE: 104 mmol/L (ref 101–111)
CO2: 25 mmol/L (ref 22–32)
CREATININE: 0.79 mg/dL (ref 0.44–1.00)
GFR calc Af Amer: >60 mL/min (ref >60)
GFR calc non Af Amer: >60 mL/min (ref >60)
Glucose, Bld: 100 mg/dL — ABNORMAL HIGH (ref 65–99)
Potassium: 4 mmol/L (ref 3.5–5.1)
SODIUM: 136 mmol/L (ref 135–145)

## 2016-05-11 MED ORDER — ACETAMINOPHEN 500 MG PO TABS
1000.0000 mg | ORAL_TABLET | Freq: Once | ORAL | Status: AC
  Administered 2016-05-11: 1000 mg via ORAL
  Filled 2016-05-11: qty 2

## 2016-05-11 MED ORDER — IBUPROFEN 800 MG PO TABS
800.0000 mg | ORAL_TABLET | Freq: Once | ORAL | Status: AC
  Administered 2016-05-11: 800 mg via ORAL
  Filled 2016-05-11: qty 1

## 2016-05-11 NOTE — ED Provider Notes (Signed)
[facility] Note   CSN: [id number] Arrival date & time: 05/11/16  1237     History   Chief Complaint Chief Complaint  Patient presents with  . Numbness  . Irregular Heart Beat    HPI Rhonda Whitaker is a 30 y.o. female.  30 yo F with a chief complaint of chest pain. Nothing seems to make this better or worse. Been going on for the past couple days. She thinks is related to her prior iron deficiency anemia. She denies significant vaginal bleeding denies rectal bleeding. Has been eating and drinking normally. Denies fevers or chills. On review of systems patient endorses some polyphagia and polydipsia.   The history is provided by the patient.  Chest Pain   This is a new problem. The current episode started yesterday. The problem occurs constantly. The problem has not changed since onset.The pain is present in the substernal region. The pain is at a severity of 8/10. The pain is moderate. The quality of the pain is described as sharp. The pain does not radiate. Duration of episode(s) is 2 days. Pertinent negatives include no dizziness, no fever, no headaches, no nausea, no palpitations, no shortness of breath and no vomiting. She has tried nothing for the symptoms. The treatment provided no relief. Risk factors include smoking/tobacco exposure.    Past Medical History:  Diagnosis Date  . Anemia   . Anxiety   . Asthma   . Depression    No meds. currently. Pt. stopped taking  . Fatigue   . Iron deficiency   . Loss of appetite   . Pneumonia     There are no active problems to display for this patient.   Past Surgical History:  Procedure Laterality Date  . CESAREAN SECTION    . TUBAL LIGATION      OB History    Gravida Para Term Preterm AB Living   6 5 5   1 5    SAB TAB Ectopic Multiple Live Births   1     1         Home Medications    Prior to Admission medications   Medication Sig Start Date End Date Taking? Authorizing Provider    HYDROcodone-acetaminophen (NORCO/VICODIN) 5-325 MG tablet Take 1 tablet by mouth every 6 (six) hours as needed for severe pain. 03/04/16   Zadie Rhine, MD  metroNIDAZOLE (FLAGYL) 500 MG tablet Take 1 tablet (500 mg total) by mouth 2 (two) times daily. 05/30/15   Cheri Fowler, PA-C    Family History No family history on file.  Social History Social History  Substance Use Topics  . Smoking status: Current Every Day Smoker    Packs/day: 1.00    Types: Cigarettes  . Smokeless tobacco: Never Used  . Alcohol use 3.6 oz/week    6 Cans of beer per week     Comment: 2 (40oz) bottles per day     Allergies   Diflucan [fluconazole]; Percocet [oxycodone-acetaminophen]; Tramadol; and Ultram [tramadol hcl]   Review of Systems Review of Systems  Constitutional: Negative for chills and fever.  HENT: Negative for congestion and rhinorrhea.   Eyes: Negative for redness and visual disturbance.  Respiratory: Negative for shortness of breath and wheezing.   Cardiovascular: Positive for chest pain. Negative for palpitations.  Gastrointestinal: Negative for nausea and vomiting.  Endocrine: Positive for polydipsia, polyphagia and polyuria.  Genitourinary: Negative for dysuria and urgency.  Musculoskeletal: Negative for arthralgias and myalgias.  Skin: Negative for pallor and  wound.  Neurological: Negative for dizziness and headaches.     Physical Exam Updated Vital Signs BP 135/90 (BP Location: Left Arm)   Pulse 88   Temp 98.3 F (36.8 C) (Oral)   Resp 18   Ht 5\' 7"  (1.702 m)   Wt 195 lb (88.5 kg)   SpO2 99%   BMI 30.54 kg/m   Physical Exam  Constitutional: She is oriented to person, place, and time. She appears well-developed and well-nourished. No distress.  HENT:  Head: Normocephalic and atraumatic.  Eyes: EOM are normal. Pupils are equal, round, and reactive to light.  Neck: Normal range of motion. Neck supple.  Cardiovascular: Normal rate and regular rhythm.  Exam reveals no  gallop and no friction rub.   No murmur heard. Pulmonary/Chest: Effort normal. She has no wheezes. She has no rales. She exhibits tenderness (reproduces tenderness).  Abdominal: Soft. She exhibits no distension and no mass. There is no tenderness. There is no guarding.  Musculoskeletal: She exhibits no edema or tenderness.  Neurological: She is alert and oriented to person, place, and time.  Skin: Skin is warm and dry. She is not diaphoretic.  Psychiatric: She has a normal mood and affect. Her behavior is normal.  Nursing note and vitals reviewed.    ED Treatments / Results  Labs (all labs ordered are listed, but only abnormal results are displayed) Labs Reviewed  CBC WITH DIFFERENTIAL/PLATELET - Abnormal; Notable for the following:       Result Value   RDW 15.9 (*)    All other components within normal limits  BASIC METABOLIC PANEL - Abnormal; Notable for the following:    Glucose, Bld 100 (*)    All other components within normal limits    EKG  EKG Interpretation  Date/Time:  Saturday May 11 2016 12:57:03 EDT Ventricular Rate:  89 PR Interval:  140 QRS Duration: 80 QT Interval:  354 QTC Calculation: 430 R Axis:   45 Text Interpretation:  Normal sinus rhythm Abnormal ECG No significant change since last tracing Confirmed by Corleone Biegler MD, DANIEL [phone]) on 05/11/2016 1:00:11 PM       Radiology Dg Chest 2 View  Result Date: 05/11/2016 CLINICAL DATA:  Chest pain. EXAM: CHEST  2 VIEW COMPARISON:  Chest x-ray dated 03/04/2016 and chest CTA dated 03/04/2016 FINDINGS: The heart size and mediastinal contours are within normal limits. Both lungs are clear. The visualized skeletal structures are unremarkable. IMPRESSION: Normal exam. Electronically Signed   By: Francene BoyersJames  Maxwell M.D.   On: 05/11/2016 13:46    Procedures Procedures (including critical care time)  Medications Ordered in ED Medications  acetaminophen (TYLENOL) tablet 1,000 mg (not administered)  ibuprofen  (ADVIL,MOTRIN) tablet 800 mg (not administered)     Initial Impression / Assessment and Plan / ED Course  I have reviewed the triage vital signs and the nursing notes.  Pertinent labs & imaging results that were available during my care of the patient were reviewed by me and considered in my medical decision making (see chart for details).  Clinical Course    30 yo F With a chief complaint of chest pain. Likely muscular skeletal is reproduced on palpation. We'll obtain some screening labs for patient's polyphagia or dyspnea.  Discussed smoking cessation with patient and was they were offerred resources to help stop.  Total time was 5 min CPT code [id number].    Labs reassuring.  D/c home.   2:06 PM:  I have discussed the diagnosis/risks/treatment options with the patient  and family and believe the pt to be eligible for discharge home to follow-up with PCP. We also discussed returning to the ED immediately if new or worsening sx occur. We discussed the sx which are most concerning (e.g., sudden worsening pain, fever, inability to tolerate by mouth) that necessitate immediate return. Medications administered to the patient during their visit and any new prescriptions provided to the patient are listed below.  Medications given during this visit Medications  acetaminophen (TYLENOL) tablet 1,000 mg (not administered)  ibuprofen (ADVIL,MOTRIN) tablet 800 mg (not administered)     The patient appears reasonably screen and/or stabilized for discharge and I doubt any other medical condition or other [facility] requiring further screening, evaluation, or treatment in the ED at this time prior to discharge.    Final Clinical Impressions(s) / ED Diagnoses   Final diagnoses:  Chest wall pain    New Prescriptions New Prescriptions   No medications on file     Melene PlanDan Makinzee Durley, DO 05/11/16 1407

## 2016-05-11 NOTE — Discharge Instructions (Signed)
Take 4 over the counter ibuprofen tablets 3 times a day or 2 over-the-counter naproxen tablets twice a day for pain. Also take tylenol 1000mg(2 extra strength) four times a day.    

## 2016-05-11 NOTE — ED Triage Notes (Signed)
Pt also reports CP that started today, midsternal.

## 2016-05-11 NOTE — ED Triage Notes (Signed)
Pt states diffuse right side "numbness and tingling" for past 2-3 days along with a "weird feeling in my chest."  Pt h/o low iron which caused similar symptoms in the past per pt.

## 2016-06-21 IMAGING — US US TRANSVAGINAL NON-OB
1 series · 14 of 25 positions shown · non-contrast
Comparison: CT 11/14/2014 and pelvic ultrasound 11/14/2014

CLINICAL DATA: UNtreated UTI/vaginal infection/scabies from
03/31/2015. Bartholin cyst. Mid pelvic pain and back pain. Nausea
and vomiting. Four previous C-sections.

EXAM:
TRANSABDOMINAL AND TRANSVAGINAL ULTRASOUND OF PELVIS
TECHNIQUE: Both transabdominal and transvaginal ultrasound examinations of the
pelvis were performed. Transabdominal technique was performed for
global imaging of the pelvis including uterus, ovaries, adnexal
regions, and pelvic cul-de-sac. It was necessary to proceed with
endovaginal exam following the transabdominal exam to visualize the
endometrium and ovaries.

[Series 1: us transvaginal non-ob · 0.24mm/px · 14 of 52 slices shown]
[im 1/52]
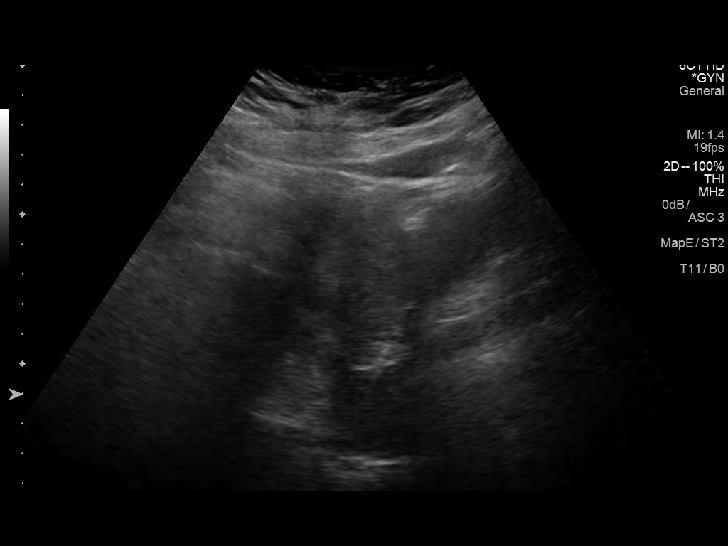
[im 5/52]
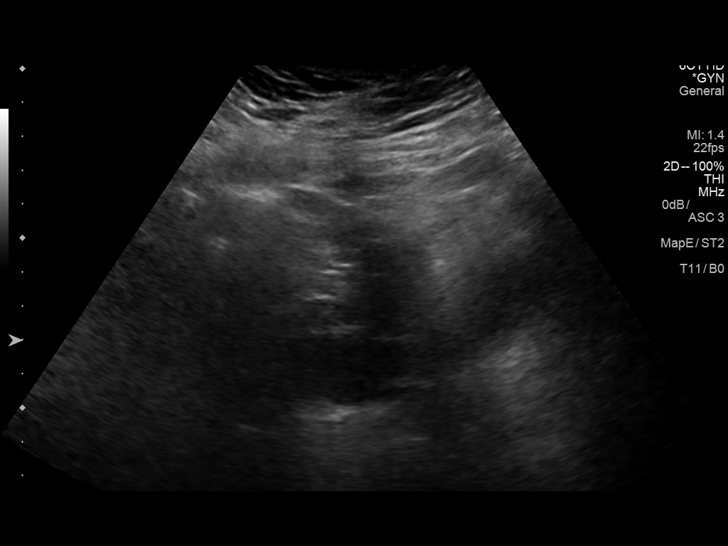
[im 9/52]
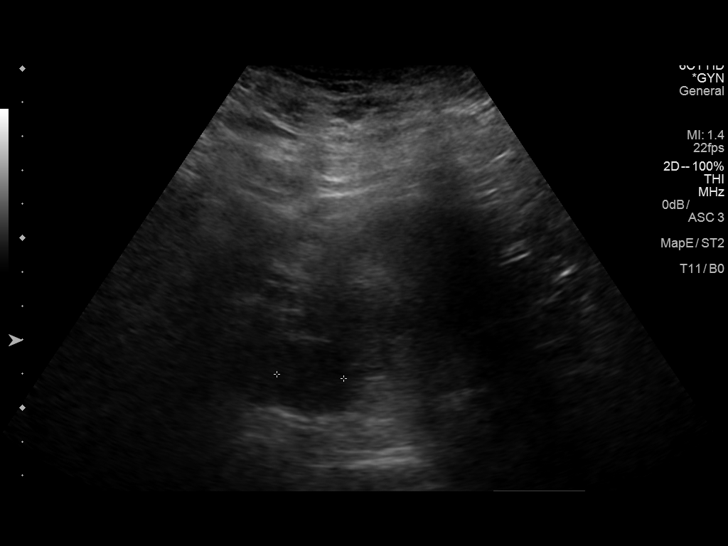
[im 13/52]
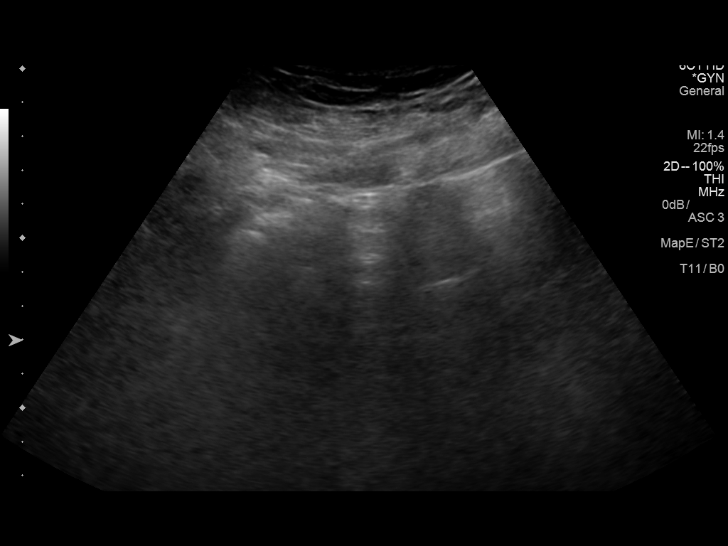
[im 18/52]
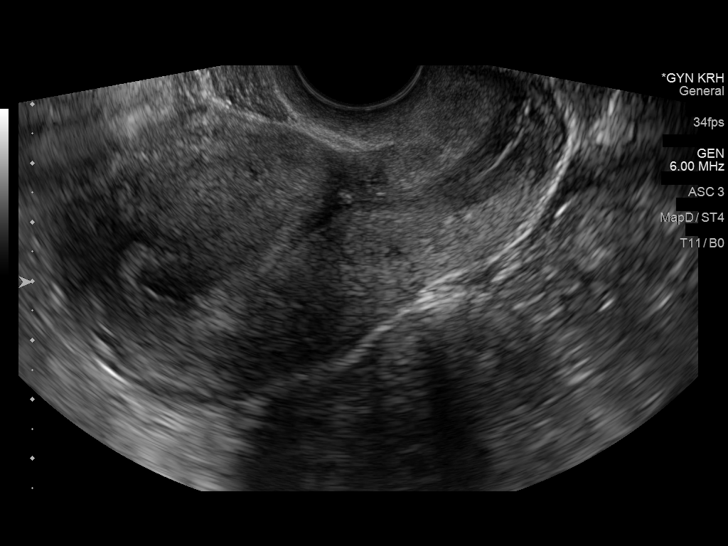
[im 20/52]
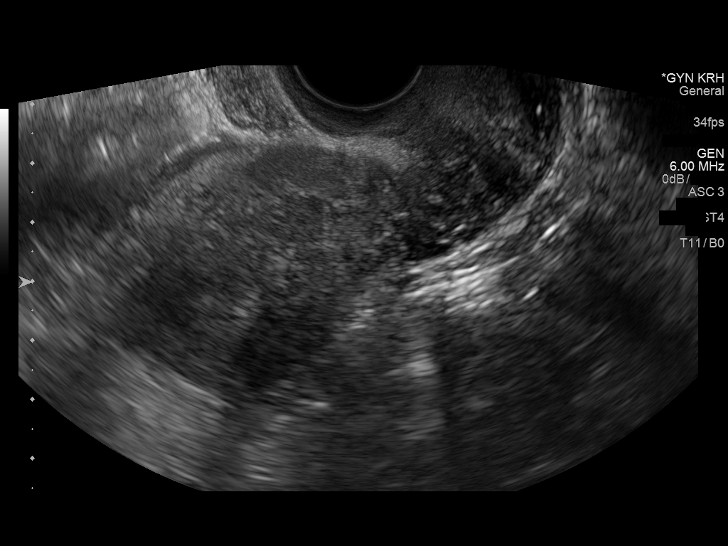
[im 24/52]
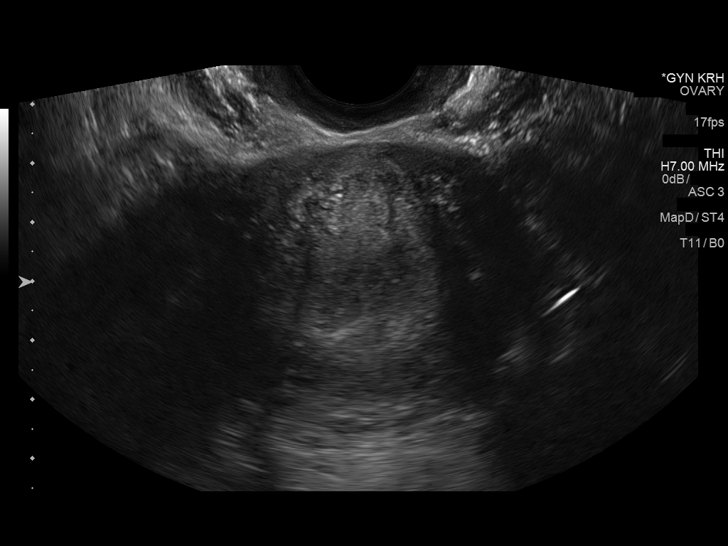
[im 28/52]
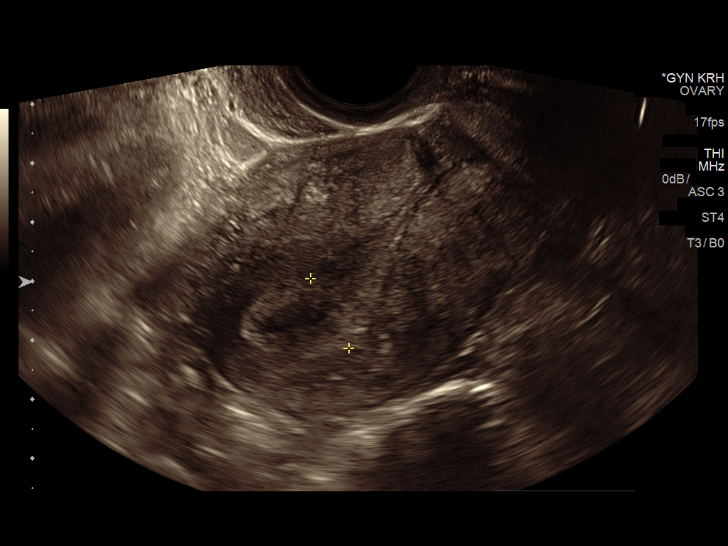
[im 32/52]
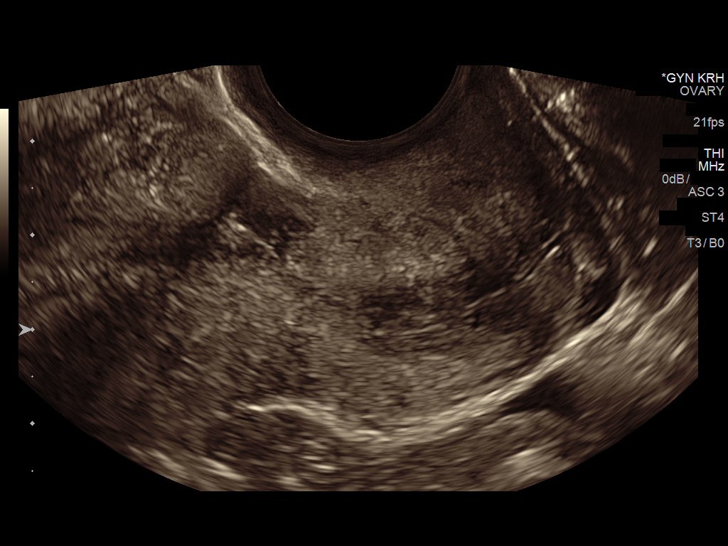
[im 35/52]
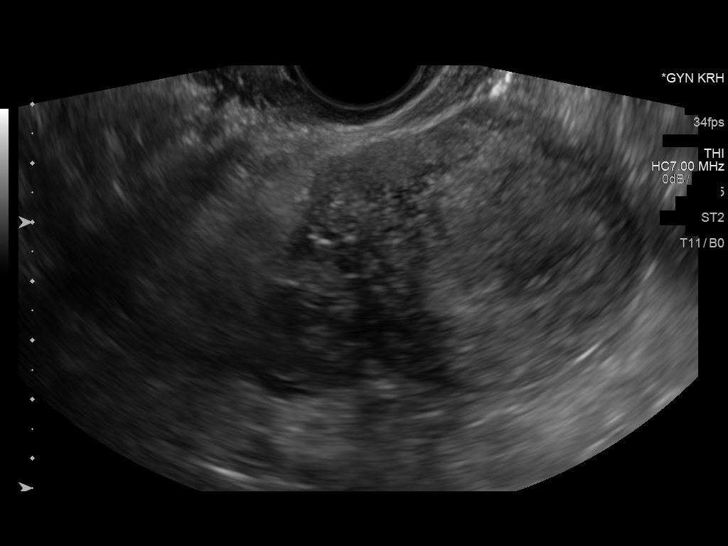
[im 39/52]
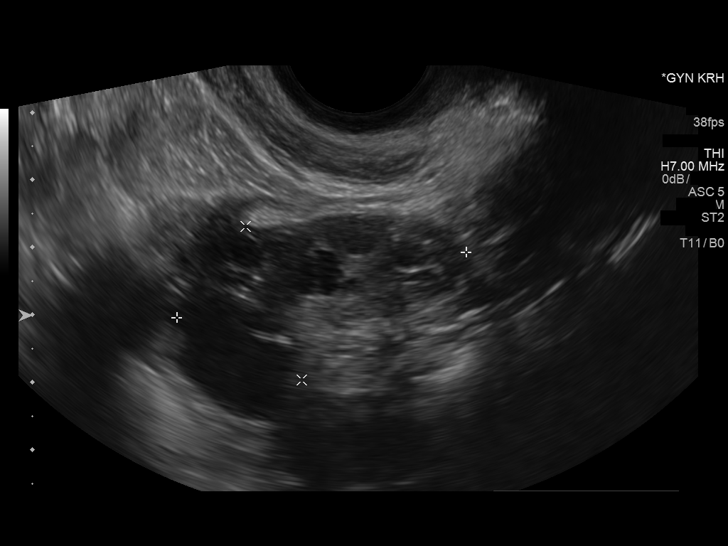
[im 43/52]
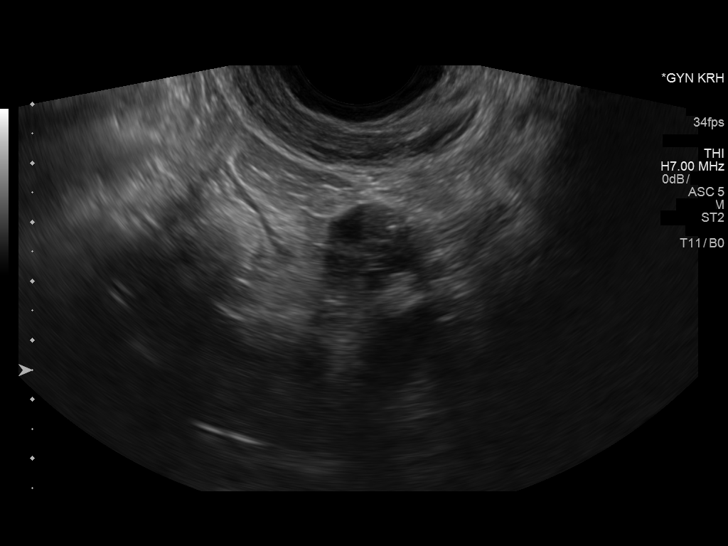
[im 47/52]
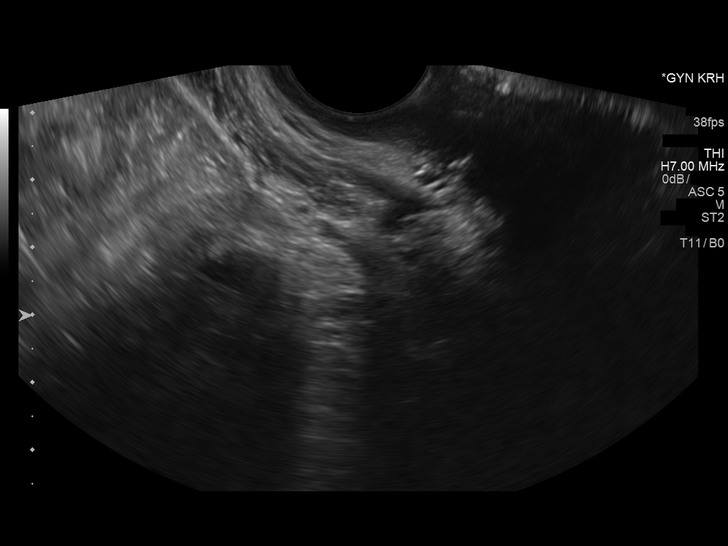
[im 52/52]
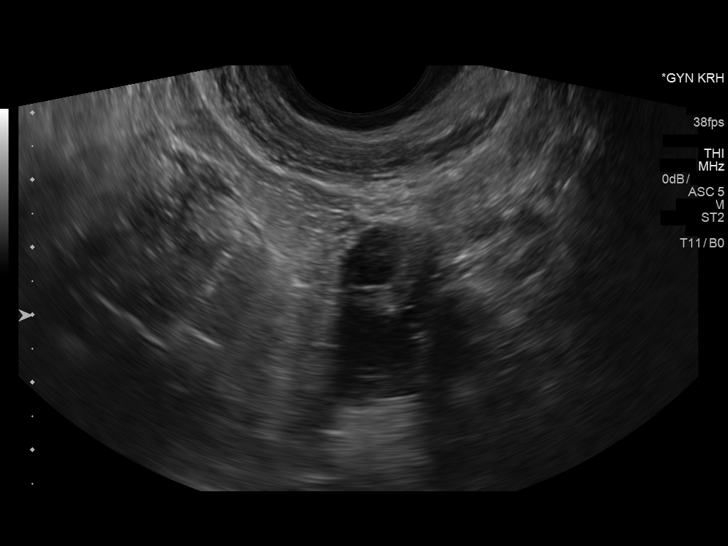

[14 of 25 positions shown; findings below may reference images not displayed]

FINDINGS: Uterus

Measurements: 4.8 x 5.7 x 9.0 cm. No fibroids or other mass
visualized.

Endometrium

Thickness: Approximately 10 mm.  Tiny amount of endometrial fluid.

Right ovary

Measurements: 2.3 x 2.4 x 2.9 cm. Normal appearance/no adnexal mass.
Normal color flow.

Left ovary

Measurements: 2.4 x 2.2 x 4.4 cm. Normal appearance/no adnexal mass.
Normal color flow.

Other findings

Trace free fluid.
IMPRESSION: Tiny amount of endometrial fluid, otherwise unremarkable pelvic
ultrasound.

## 2016-10-14 ENCOUNTER — Emergency Department (HOSPITAL_BASED_OUTPATIENT_CLINIC_OR_DEPARTMENT_OTHER)
Admission: EM | Admit: 2016-10-14 | Discharge: 2016-10-14 | Disposition: A | Discharge status: Home / Self Care | Payer: Medicaid Other | Attending: Physician Assistant | Admitting: Physician Assistant

## 2016-10-14 ENCOUNTER — Encounter (HOSPITAL_BASED_OUTPATIENT_CLINIC_OR_DEPARTMENT_OTHER): Payer: Self-pay | Admitting: Emergency Medicine

## 2016-10-14 DIAGNOSIS — H65192 Other acute nonsuppurative otitis media, left ear: Secondary | ICD-10-CM

## 2016-10-14 DIAGNOSIS — F17210 Nicotine dependence, cigarettes, uncomplicated: Secondary | ICD-10-CM | POA: Diagnosis not present

## 2016-10-14 DIAGNOSIS — J45909 Unspecified asthma, uncomplicated: Secondary | ICD-10-CM | POA: Insufficient documentation

## 2016-10-14 DIAGNOSIS — J029 Acute pharyngitis, unspecified: Secondary | ICD-10-CM | POA: Diagnosis present

## 2016-10-14 LAB — RAPID STREP SCREEN (MED CTR MEBANE ONLY): STREPTOCOCCUS, GROUP A SCREEN (DIRECT): NEGATIVE

## 2016-10-14 MED ORDER — OXYCODONE-ACETAMINOPHEN 5-325 MG PO TABS
1.0000 | ORAL_TABLET | Freq: Once | ORAL | Status: AC
  Administered 2016-10-14: 1 via ORAL
  Filled 2016-10-14: qty 1

## 2016-10-14 MED ORDER — FLUCONAZOLE 150 MG PO TABS
150.0000 mg | ORAL_TABLET | Freq: Every day | ORAL | 0 refills | Status: AC
Start: 2016-10-14 — End: 2016-10-15

## 2016-10-14 MED ORDER — AMOXICILLIN 500 MG PO CAPS
500.0000 mg | ORAL_CAPSULE | Freq: Once | ORAL | Status: AC
  Administered 2016-10-14: 500 mg via ORAL
  Filled 2016-10-14: qty 1

## 2016-10-14 MED ORDER — AMOXICILLIN 500 MG PO CAPS: 500.0000 mg | ORAL_CAPSULE | Freq: Three times a day (TID) | ORAL | 0 refills | Status: AC

## 2016-10-14 MED ORDER — ONDANSETRON 4 MG PO TBDP
4.0000 mg | ORAL_TABLET | Freq: Once | ORAL | Status: AC
  Administered 2016-10-14: 4 mg via ORAL
  Filled 2016-10-14: qty 1

## 2016-10-14 MED FILL — FLUCONAZOLE 150 MG TABLET: 150 | 1 days supply | Qty: 1 | Fill #0

## 2016-10-14 MED FILL — AMOXICILLIN 500 MG CAPSULE: 500 | 7 days supply | Qty: 21 | Fill #0

## 2016-10-14 NOTE — ED Provider Notes (Addendum)
[facility]   CSN: [id number] Arrival date & time: 10/14/16  1026     History   Chief Complaint Chief Complaint  Patient presents with  . Influenza    HPI Cloris Flippo is a 31 y.o. female.  HPI  She is a 31 year old female presenting with left ear fullness and pain. Patient also has had nasal drainage, sore throat, headaches. Patient reports fatigue and muscle aches. Patient's daughter has been ill with the same thing. Patient brought her daughter here to be seen at the same time she's been seen.  Denies fever, endorses chills.  Past Medical History:  Diagnosis Date  . Anemia   . Anxiety   . Asthma   . Depression    No meds. currently. Pt. stopped taking  . Fatigue   . Iron deficiency   . Loss of appetite   . Pneumonia     There are no active problems to display for this patient.   Past Surgical History:  Procedure Laterality Date  . CESAREAN SECTION    . TUBAL LIGATION      OB History    Gravida Para Term Preterm AB Living   SAB TAB Ectopic Multiple Live Births   1     1         Home Medications    Prior to Admission medications   Medication Sig Start Date End Date Taking? Authorizing Provider  HYDROcodone-acetaminophen (NORCO/VICODIN) 5-325 MG tablet Take 1 tablet by mouth every 6 (six) hours as needed for severe pain. 03/04/16   Zadie Rhine, MD  metroNIDAZOLE (FLAGYL) 500 MG tablet Take 1 tablet (500 mg total) by mouth 2 (two) times daily. 05/30/15   Cheri Fowler, PA-C    Family History No family history on file.  Social History Social History  Substance Use Topics  . Smoking status: Current Every Day Smoker    Packs/day: 1.00    Types: Cigarettes  . Smokeless tobacco: Never Used  . Alcohol use 3.6 oz/week    6 Cans of beer per week     Comment: 2 (40oz) bottles per day     Allergies   Percocet [oxycodone-acetaminophen]; Tramadol; and Ultram [tramadol hcl]   Review of Systems Review of  Systems  Constitutional: Negative for activity change.  HENT: Positive for congestion, ear pain, sinus pain, sinus pressure, sore throat and voice change.   Respiratory: Negative for shortness of breath.   Cardiovascular: Negative for chest pain.  Gastrointestinal: Negative for abdominal pain.  Musculoskeletal: Positive for arthralgias.  All other systems reviewed and are negative.    Physical Exam Updated Vital Signs BP (!) 143/83   Pulse 97   Temp 98.7 F (37.1 C) (Oral)   Resp 18   Ht  (1.702 m)   Wt 200 lb (90.7 kg)   SpO2 100%   BMI 31.32 kg/m   Physical Exam  Constitutional: She is oriented to person, place, and time. She appears well-developed and well-nourished.  HENT:  Head: Normocephalic and atraumatic.  Right Ear: External ear normal.  Left Ear: External ear normal.  Mouth/Throat: No oropharyngeal exudate.  Left TM is red, bulging. The canal is also erythematous.  Eyes: EOM are normal. Pupils are equal, round, and reactive to light. Right eye exhibits no discharge. Left eye exhibits no discharge.  Cardiovascular: Normal rate and regular rhythm.   Pulmonary/Chest: Effort normal and breath sounds normal. No respiratory distress. She has  no wheezes.  Neurological: She is oriented to person, place, and time.  Skin: Skin is warm and dry. She is not diaphoretic.  Psychiatric: She has a normal mood and affect.  Nursing note and vitals reviewed.    ED Treatments / Results  Labs (all labs ordered are listed, but only abnormal results are displayed) Labs Reviewed  RAPID STREP SCREEN (NOT AT [facility])    EKG  EKG Interpretation None       Radiology No results found.  Procedures Procedures (including critical care time)  Medications Ordered in ED Medications  oxyCODONE-acetaminophen (PERCOCET/ROXICET) 5-325 MG per tablet 1 tablet (not administered)  ondansetron (ZOFRAN-ODT) disintegrating tablet 4 mg (not administered)  amoxicillin (AMOXIL) capsule  500 mg (not administered)     Initial Impression / Assessment and Plan / ED Course  I have reviewed the triage vital signs and the nursing notes.  Pertinent labs & imaging results that were available during my care of the patient were reviewed by me and considered in my medical decision making (see chart for details).     Patient is a 31 year old female with normal vital signs presenting with left ear pain and fullness. Patient has left bulging TM with erythema. Likely otitis media. Patient also has other symptoms of viral infection including sinus pressure, sore throat. We will send a rapid strep. We'll treat with amoxicillin. Will have follow-up with primary care physician as needed. Doubt mastoditis,doubt intracranial infection.  She is taking by mouth, normal vital signs, afebrile.  Neg strep. Requesting diflcuan for yeast associated with abx use.   Final Clinical Impressions(s) / ED Diagnoses   Final diagnoses:  None    New Prescriptions New Prescriptions   No medications on file     Coy Rochford Randall An, MD 10/14/16 1110    Cuca Benassi Lyn Gordon Carlson, MD 10/14/16 1215

## 2016-10-14 NOTE — ED Triage Notes (Signed)
Aching all over, chills, sore throat, nasal drainage, left ear pain x 2 days

## 2016-10-14 NOTE — Discharge Instructions (Signed)
Please return with worsening symptoms, increased pain, fevers or other concerns.

## 2016-10-14 NOTE — ED Notes (Signed)
Patient claimed that she hurts all-over but her ear hurts worst like throbbing pain.  It's been like this all night.  "My left  Arm also hurts more than the rest of my body."

## 2016-10-17 LAB — CULTURE, GROUP A STREP (THRC)

## 2017-04-07 ENCOUNTER — Encounter (HOSPITAL_BASED_OUTPATIENT_CLINIC_OR_DEPARTMENT_OTHER): Payer: Self-pay | Admitting: *Deleted

## 2017-04-07 ENCOUNTER — Emergency Department (HOSPITAL_BASED_OUTPATIENT_CLINIC_OR_DEPARTMENT_OTHER): Payer: Medicaid Other

## 2017-04-07 ENCOUNTER — Emergency Department (HOSPITAL_BASED_OUTPATIENT_CLINIC_OR_DEPARTMENT_OTHER)
Admission: EM | Admit: 2017-04-07 | Discharge: 2017-04-07 | Disposition: A | Discharge status: Home / Self Care | Payer: Medicaid Other | Attending: Emergency Medicine | Admitting: Emergency Medicine

## 2017-04-07 DIAGNOSIS — N939 Abnormal uterine and vaginal bleeding, unspecified: Secondary | ICD-10-CM | POA: Insufficient documentation

## 2017-04-07 DIAGNOSIS — J45909 Unspecified asthma, uncomplicated: Secondary | ICD-10-CM | POA: Insufficient documentation

## 2017-04-07 DIAGNOSIS — R109 Unspecified abdominal pain: Secondary | ICD-10-CM | POA: Diagnosis present

## 2017-04-07 DIAGNOSIS — Z79899 Other long term (current) drug therapy: Secondary | ICD-10-CM | POA: Diagnosis not present

## 2017-04-07 DIAGNOSIS — F17210 Nicotine dependence, cigarettes, uncomplicated: Secondary | ICD-10-CM | POA: Insufficient documentation

## 2017-04-07 DIAGNOSIS — N739 Female pelvic inflammatory disease, unspecified: Secondary | ICD-10-CM | POA: Diagnosis not present

## 2017-04-07 DIAGNOSIS — N730 Acute parametritis and pelvic cellulitis: Secondary | ICD-10-CM

## 2017-04-07 LAB — COMPREHENSIVE METABOLIC PANEL
ALBUMIN: 4.1 g/dL (ref 3.5–5.0)
ALT: 23 U/L (ref 14–54)
ANION GAP: 8 (ref 5–15)
AST: 26 U/L (ref 15–41)
Alkaline Phosphatase: 94 U/L (ref 38–126)
BUN: 6 mg/dL (ref 6–20)
CO2: 26 mmol/L (ref 22–32)
Calcium: 9.1 mg/dL (ref 8.9–10.3)
Chloride: 103 mmol/L (ref 101–111)
Creatinine, Ser: 0.84 mg/dL (ref 0.44–1.00)
GFR calc Af Amer: >60 mL/min (ref >60)
GFR calc non Af Amer: >60 mL/min (ref >60)
GLUCOSE: 85 mg/dL (ref 65–99)
POTASSIUM: 3.5 mmol/L (ref 3.5–5.1)
SODIUM: 137 mmol/L (ref 135–145)
Total Bilirubin: 0.6 mg/dL (ref 0.3–1.2)
Total Protein: 8 g/dL (ref 6.5–8.1)

## 2017-04-07 LAB — WET PREP, GENITAL
CLUE CELLS WET PREP: NONE SEEN
Sperm: NONE SEEN
Trich, Wet Prep: NONE SEEN
Yeast Wet Prep HPF POC: NONE SEEN

## 2017-04-07 LAB — CBC WITH DIFFERENTIAL/PLATELET
Basophils Absolute: 0 10*3/uL (ref 0.0–0.1)
Basophils Relative: 0 %
Eosinophils Absolute: 0.1 10*3/uL (ref 0.0–0.7)
Eosinophils Relative: 2 %
HEMATOCRIT: 42 % (ref 36.0–46.0)
Hemoglobin: 15 g/dL (ref 12.0–15.0)
LYMPHS ABS: 2.3 10*3/uL (ref 0.7–4.0)
Lymphocytes Relative: 33 %
MCH: 32.8 pg (ref 26.0–34.0)
MCHC: 35.7 g/dL (ref 30.0–36.0)
MCV: 91.7 fL (ref 78.0–100.0)
MONO ABS: 0.6 10*3/uL (ref 0.1–1.0)
MONOS PCT: 8 %
NEUTROS ABS: 3.8 10*3/uL (ref 1.7–7.7)
Neutrophils Relative %: 57 %
Platelets: 244 10*3/uL (ref 150–400)
RBC: 4.58 MIL/uL (ref 3.87–5.11)
RDW: 13.7 % (ref 11.5–15.5)
WBC: 6.8 10*3/uL (ref 4.0–10.5)

## 2017-04-07 LAB — URINALYSIS, ROUTINE W REFLEX MICROSCOPIC
Bilirubin Urine: NEGATIVE
Glucose, UA: NEGATIVE mg/dL
Hgb urine dipstick: NEGATIVE
Ketones, ur: NEGATIVE mg/dL
Leukocytes, UA: NEGATIVE
Nitrite: NEGATIVE
Protein, ur: NEGATIVE mg/dL
Specific Gravity, Urine: 1.01 (ref 1.005–1.030)
pH: 6.5 (ref 5.0–8.0)

## 2017-04-07 LAB — PREGNANCY, URINE: Preg Test, Ur: NEGATIVE

## 2017-04-07 LAB — LIPASE, BLOOD: LIPASE: 28 U/L (ref 11–51)

## 2017-04-07 MED ORDER — PROMETHAZINE HCL 25 MG PO TABS: 25.0000 mg | ORAL_TABLET | Freq: Four times a day (QID) | ORAL | 1 refills | Status: AC | PRN

## 2017-04-07 MED ORDER — ONDANSETRON HCL 4 MG/2ML IJ SOLN
4.0000 mg | Freq: Once | INTRAMUSCULAR | Status: AC
  Administered 2017-04-07: 4 mg via INTRAVENOUS
  Filled 2017-04-07: qty 2

## 2017-04-07 MED ORDER — DOXYCYCLINE HYCLATE 100 MG PO CAPS: 100.0000 mg | ORAL_CAPSULE | Freq: Two times a day (BID) | ORAL | 0 refills | Status: DC

## 2017-04-07 MED ORDER — DEXTROSE 5 % IV SOLN: 1.0000 g | Freq: Once | INTRAVENOUS | Status: DC

## 2017-04-07 MED ORDER — HYDROCODONE-ACETAMINOPHEN 5-325 MG PO TABS: 1.0000 | ORAL_TABLET | Freq: Four times a day (QID) | ORAL | 0 refills | Status: AC | PRN

## 2017-04-07 MED ORDER — IOPAMIDOL (ISOVUE-300) INJECTION 61%
100.0000 mL | Freq: Once | INTRAVENOUS | Status: AC | PRN
  Administered 2017-04-07: 100 mL via INTRAVENOUS

## 2017-04-07 MED ORDER — DIPHENHYDRAMINE HCL 50 MG/ML IJ SOLN
25.0000 mg | Freq: Once | INTRAMUSCULAR | Status: AC
  Administered 2017-04-07: 25 mg via INTRAVENOUS
  Filled 2017-04-07: qty 1

## 2017-04-07 MED ORDER — SODIUM CHLORIDE 0.9 % IV SOLN
INTRAVENOUS | Status: DC
  Administered 2017-04-07: 16:00:00 via INTRAVENOUS

## 2017-04-07 MED ORDER — HYDROMORPHONE HCL 1 MG/ML IJ SOLN
1.0000 mg | Freq: Once | INTRAMUSCULAR | Status: AC
  Administered 2017-04-07: 1 mg via INTRAVENOUS
  Filled 2017-04-07: qty 1

## 2017-04-07 MED ORDER — DEXTROSE 5 % IV SOLN
2.0000 g | Freq: Once | INTRAVENOUS | Status: AC
  Administered 2017-04-07: 2 g via INTRAVENOUS
  Filled 2017-04-07: qty 2

## 2017-04-07 NOTE — Discharge Instructions (Signed)
Take antibiotic as directed for the next 2 weeks. Take the Phenergan as needed for nausea. To the hydrocodone as needed for pain. Return for a newer worse symptoms. Make an appointment to follow-up with your doctor to make sure you get better.

## 2017-04-07 NOTE — ED Triage Notes (Signed)
Headache, abdominal pain, vomiting x 3 days. Vaginal discharge. States she feels tired. Her fingers of her left hand have been getting numb on and off x 3 days.

## 2017-04-07 NOTE — ED Notes (Signed)
Pelvic cart set up at bedside  

## 2017-04-07 NOTE — ED Provider Notes (Signed)
[facility] Provider Note   CSN: [id number] Arrival date & time: 04/07/17  1328     History   Chief Complaint Chief Complaint  Patient presents with  . Abdominal Pain  . Vaginal Discharge    HPI Rhonda Whitaker is a 31 y.o. female.  Patient presenting with abdominal pain and vomiting for 3 days. Had mild headache. Also vaginal discharge. Patient also feels fatigue. Had some tingling in her left hand intermittently getting numb on and off for the past 3 days. Patient had past history of PID. Patient's abdominal pain is in the epigastric area as well as bilateral lower quadrants. This started on Friday. She had nausea and vomiting on Friday and Saturday. Just had nausea since then. No diarrhea no dysuria. No fevers.      Past Medical History:  Diagnosis Date  . Anemia   . Anxiety   . Asthma   . Depression    No meds. currently. Pt. stopped taking  . Fatigue   . Iron deficiency   . Loss of appetite   . Pneumonia     There are no active problems to display for this patient.   Past Surgical History:  Procedure Laterality Date  . CESAREAN SECTION    . TUBAL LIGATION      OB History    Gravida Para Term Preterm AB Living   SAB TAB Ectopic Multiple Live Births   1     1         Home Medications    Prior to Admission medications   Medication Sig Start Date End Date Taking? Authorizing Provider  amoxicillin (AMOXIL) 500 MG capsule Take 1 capsule (500 mg total) by mouth 3 (three) times daily. 10/14/16   Mackuen, Courteney Lyn, MD  doxycycline (VIBRAMYCIN) 100 MG capsule Take 1 capsule (100 mg total) by mouth 2 (two) times daily. 04/07/17   Vanetta Mulders, MD  HYDROcodone-acetaminophen (NORCO/VICODIN) 5-325 MG tablet Take 1 tablet by mouth every 6 (six) hours as needed for severe pain. 03/04/16   Zadie Rhine, MD  HYDROcodone-acetaminophen (NORCO/VICODIN) 5-325 MG tablet Take 1-2 tablets by mouth every 6 (six) hours as needed for  moderate pain. 04/07/17   Vanetta Mulders, MD  metroNIDAZOLE (FLAGYL) 500 MG tablet Take 1 tablet (500 mg total) by mouth 2 (two) times daily. 05/30/15   Cheri Fowler, PA-C  promethazine (PHENERGAN) 25 MG tablet Take 1 tablet (25 mg total) by mouth every 6 (six) hours as needed for nausea or vomiting. 04/07/17   Vanetta Mulders, MD    Family History No family history on file.  Social History Social History  Substance Use Topics  . Smoking status: Current Every Day Smoker    Packs/day: 1.00    Types: Cigarettes  . Smokeless tobacco: Never Used  . Alcohol use 3.6 oz/week    6 Cans of beer per week     Comment: 2 (40oz) bottles per day     Allergies   Percocet [oxycodone-acetaminophen]; Tramadol; and Ultram [tramadol hcl]   Review of Systems Review of Systems  Constitutional: Positive for fatigue. Negative for fever.  HENT: Negative for congestion.   Eyes: Negative for visual disturbance.  Respiratory: Negative for shortness of breath.   Cardiovascular: Negative for chest pain.  Gastrointestinal: Positive for abdominal pain, nausea and vomiting. Negative for diarrhea.  Genitourinary: Positive for vaginal discharge. Negative for dysuria.  Musculoskeletal: Negative for back pain and myalgias.  Skin: Negative  for rash.  Neurological: Positive for headaches.  Psychiatric/Behavioral: Negative for confusion.     Physical Exam Updated Vital Signs BP (!) 127/92 (BP Location: Left Arm)   Pulse 66   Temp 98.7 F (37.1 C) (Oral)   Resp 18   Ht 1.702 m ( )   Wt 90.7 kg (200 lb)   SpO2 100%   BMI 31.32 kg/m   Physical Exam  Constitutional: She is oriented to person, place, and time. She appears well-developed and well-nourished. No distress.  HENT:  Head: Normocephalic and atraumatic.  Mouth/Throat: Oropharynx is clear and moist.  Eyes: Pupils are equal, round, and reactive to light. Conjunctivae and EOM are normal.  Neck: Normal range of motion. Neck supple.    Cardiovascular: Normal rate and regular rhythm.   Pulmonary/Chest: Effort normal and breath sounds normal. No respiratory distress.  Abdominal: Soft. Bowel sounds are normal. There is no tenderness.  Genitourinary:  Genitourinary Comments: External genitalia normal. Slight vaginal discharge white in nature. No vaginal bleeding. No cervical motion tenderness. Tenderness to uterus and left adnexa area.  Musculoskeletal: Normal range of motion. She exhibits no edema.  Neurological: She is alert and oriented to person, place, and time. No cranial nerve deficit. She exhibits normal muscle tone. Coordination normal.  Skin: Skin is warm. No rash noted.  Nursing note and vitals reviewed.    ED Treatments / Results  Labs (all labs ordered are listed, but only abnormal results are displayed) Labs Reviewed  WET PREP, GENITAL - Abnormal; Notable for the following:       Result Value   WBC, Wet Prep HPF POC FEW (*)    All other components within normal limits  URINALYSIS, ROUTINE W REFLEX MICROSCOPIC  PREGNANCY, URINE  LIPASE, BLOOD  COMPREHENSIVE METABOLIC PANEL  CBC WITH DIFFERENTIAL/PLATELET  RPR  HIV ANTIBODY (ROUTINE TESTING)  GC/CHLAMYDIA PROBE AMP ([facility]) NOT AT [facility]    EKG  EKG Interpretation None       Radiology Ct Abdomen Pelvis W Contrast  Result Date: 04/07/2017 CLINICAL DATA:  Initial evaluation for acute lower abdominal pain. EXAM: CT ABDOMEN AND PELVIS WITH CONTRAST TECHNIQUE: Multidetector CT imaging of the abdomen and pelvis was performed using the standard protocol following bolus administration of intravenous contrast. CONTRAST:  ISOVUE-300 IOPAMIDOL (ISOVUE-300) INJECTION 61% COMPARISON:  Prior CT from 12/05/2016. FINDINGS: Lower chest: Hazy atelectatic changes present dependently within the visualized lung bases. Visualized lungs are otherwise clear. Hepatobiliary: Mild diffuse hypoattenuation liver suggestive of steatosis. Liver otherwise unremarkable.  Gallbladder within normal limits. No biliary dilatation. Pancreas: Pancreas within normal limits. Spleen: Spleen within normal limits. Adrenals/Urinary Tract: Adrenal glands are normal. Kidneys equal in size with symmetric enhancement. No nephrolithiasis, hydronephrosis, or focal enhancing renal mass. No hydroureter. Bladder within normal limits. Stomach/Bowel: Stomach within normal limits. No evidence for bowel obstruction. Appendix within normal limits. No acute inflammatory changes seen about the bowels. Vascular/Lymphatic: Normal intravascular enhancement seen throughout the intra-abdominal aorta and its branch vessels. No aneurysm. No adenopathy. Reproductive: Uterus and ovaries within normal limits for age. Small follicle/physiologic cyst noted about the ovaries bilaterally. Other: No free air or fluid. Small fat containing paraumbilical hernia without associated inflammation. Postsurgical changes at the lower rectus abdominus musculature. Musculoskeletal: No acute osseus abnormality. No worrisome lytic or blastic osseous lesions. IMPRESSION: 1. No CT evidence for acute intra-abdominal or pelvic process. 2. Hepatic steatosis. Electronically Signed   By: Rise Mu M.D.   On: 04/07/2017 18:15    Procedures Procedures (including  critical care time)  Medications Ordered in ED Medications  0.9 %  sodium chloride infusion ( Intravenous New Bag/Given 04/07/17 1555)  ondansetron (ZOFRAN) injection 4 mg (4 mg Intravenous Given 04/07/17 1556)  HYDROmorphone (DILAUDID) injection 1 mg (1 mg Intravenous Given 04/07/17 1556)  cefTRIAXone (ROCEPHIN) 2 g in dextrose 5 % 50 mL IVPB (0 g Intravenous Stopped 04/07/17 1706)  diphenhydrAMINE (BENADRYL) injection 25 mg (25 mg Intravenous Given 04/07/17 1712)  iopamidol (ISOVUE-300) 61 % injection 100 mL (100 mLs Intravenous Contrast Given 04/07/17 1750)     Initial Impression / Assessment and Plan / ED Course  I have reviewed the triage vital signs and the  nursing notes.  Pertinent labs & imaging results that were available during my care of the patient were reviewed by me and considered in my medical decision making (see chart for details).     Workup including pelvic wet prep without any significant findings. The patient does have uterine and left adnexal tenderness. Patency test is negative. Will treat clinically as if PID. CT of the abdomen was also done because of the epigastric abdominal pain no acute findings. Patient treated with pain medicine and antinausea medicine and had significant improvement. Patient will be continued on doxycycline as possible PID. Also Phenergan for the nausea and vomiting. She will follow-up with her primary care doctor. Pelvic cultures are pending.  Final Clinical Impressions(s) / ED Diagnoses   Final diagnoses:  PID (acute pelvic inflammatory disease)    New Prescriptions New Prescriptions   DOXYCYCLINE (VIBRAMYCIN) 100 MG CAPSULE    Take 1 capsule (100 mg total) by mouth 2 (two) times daily.   HYDROCODONE-ACETAMINOPHEN (NORCO/VICODIN) 5-325 MG TABLET    Take 1-2 tablets by mouth every 6 (six) hours as needed for moderate pain.   PROMETHAZINE (PHENERGAN) 25 MG TABLET    Take 1 tablet (25 mg total) by mouth every 6 (six) hours as needed for nausea or vomiting.     Vanetta Mulders, MD 04/07/17 [phone]

## 2017-04-07 NOTE — ED Notes (Signed)
Pt verbalized understanding of discharge instructions and denies any further questions at this time.   

## 2017-04-08 LAB — GC/CHLAMYDIA PROBE AMP (~~LOC~~) NOT AT ARMC
CHLAMYDIA, DNA PROBE: NEGATIVE
NEISSERIA GONORRHEA: NEGATIVE

## 2017-04-08 LAB — RPR: RPR Ser Ql: NONREACTIVE

## 2017-04-08 LAB — HIV ANTIBODY (ROUTINE TESTING W REFLEX): HIV SCREEN 4TH GENERATION: NONREACTIVE

## 2017-05-30 ENCOUNTER — Emergency Department (HOSPITAL_BASED_OUTPATIENT_CLINIC_OR_DEPARTMENT_OTHER)
Admission: EM | Admit: 2017-05-30 | Discharge: 2017-05-30 | Disposition: A | Discharge status: Home / Self Care | Payer: Medicaid Other | Attending: Emergency Medicine | Admitting: Emergency Medicine

## 2017-05-30 ENCOUNTER — Encounter (HOSPITAL_BASED_OUTPATIENT_CLINIC_OR_DEPARTMENT_OTHER): Payer: Self-pay | Admitting: *Deleted

## 2017-05-30 ENCOUNTER — Other Ambulatory Visit: Payer: Self-pay

## 2017-05-30 DIAGNOSIS — R110 Nausea: Secondary | ICD-10-CM | POA: Insufficient documentation

## 2017-05-30 DIAGNOSIS — R109 Unspecified abdominal pain: Secondary | ICD-10-CM | POA: Insufficient documentation

## 2017-05-30 DIAGNOSIS — F17210 Nicotine dependence, cigarettes, uncomplicated: Secondary | ICD-10-CM | POA: Insufficient documentation

## 2017-05-30 DIAGNOSIS — M79675 Pain in left toe(s): Secondary | ICD-10-CM | POA: Diagnosis not present

## 2017-05-30 DIAGNOSIS — N898 Other specified noninflammatory disorders of vagina: Secondary | ICD-10-CM | POA: Diagnosis present

## 2017-05-30 DIAGNOSIS — J45909 Unspecified asthma, uncomplicated: Secondary | ICD-10-CM | POA: Insufficient documentation

## 2017-05-30 DIAGNOSIS — N760 Acute vaginitis: Secondary | ICD-10-CM | POA: Insufficient documentation

## 2017-05-30 DIAGNOSIS — L0232 Furuncle of buttock: Secondary | ICD-10-CM

## 2017-05-30 DIAGNOSIS — B9689 Other specified bacterial agents as the cause of diseases classified elsewhere: Secondary | ICD-10-CM | POA: Diagnosis not present

## 2017-05-30 LAB — URINALYSIS, ROUTINE W REFLEX MICROSCOPIC
Bilirubin Urine: NEGATIVE
GLUCOSE, UA: NEGATIVE mg/dL
HGB URINE DIPSTICK: NEGATIVE
Ketones, ur: NEGATIVE mg/dL
LEUKOCYTES UA: NEGATIVE
Nitrite: NEGATIVE
PROTEIN: NEGATIVE mg/dL
Specific Gravity, Urine: <1.005 — ABNORMAL LOW (ref 1.005–1.030)
pH: 6 (ref 5.0–8.0)

## 2017-05-30 LAB — WET PREP, GENITAL
Sperm: NONE SEEN
TRICH WET PREP: NONE SEEN
WBC, Wet Prep HPF POC: NONE SEEN
Yeast Wet Prep HPF POC: NONE SEEN

## 2017-05-30 LAB — PREGNANCY, URINE: PREG TEST UR: NEGATIVE

## 2017-05-30 MED ORDER — METRONIDAZOLE 0.75 % VA GEL: 1.0000 | Freq: Every day | VAGINAL | 0 refills | Status: AC

## 2017-05-30 MED ORDER — DOXYCYCLINE HYCLATE 100 MG PO CAPS: 100.0000 mg | ORAL_CAPSULE | Freq: Two times a day (BID) | ORAL | 0 refills | Status: AC

## 2017-05-30 MED ORDER — NAPROXEN 500 MG PO TABS: 500.0000 mg | ORAL_TABLET | Freq: Two times a day (BID) | ORAL | 0 refills | Status: AC

## 2017-05-30 NOTE — ED Provider Notes (Signed)
[facility] EMERGENCY DEPARTMENT Provider Note   CSN: [id number] Arrival date & time: 05/30/17  1336     History   Chief Complaint Chief Complaint  Patient presents with  . Vaginal Discharge    HPI Rhonda Whitaker is a 31 y.o. female presenting for evaluation of vaginal discharge, abdominal pain, left foot pain, and buttock lesion.  Patient states she has had several month history of vaginal discharge and abdominal pain.  She was evaluated in September for the same, given antibiotics, and symptoms resolved.  3 days ago, she had return of vaginal symptoms including itching and smell.  She has associated suprapubic and epigastric cramping.  This is the same as her sxs in September. The pain is constant.  She has not taken anything for this.  Nothing makes it better or worse.  She reports nausea and no vomiting.  She is tolerating p.o. without difficulty.  She denies dysuria, urinary frequency, or hematuria.  She reports multiple loose stools last night. She denies fevers, chills, chest pain, shortness of breath.  She no longer has periods due to an ablation.  She is sexually active with 2 female partners, and they do not always use protection. Additionally patient reporting left foot pain.  She has pain along the medial side of her left big toe and at the plantar base of her second and third toes.  This is been going on for several weeks to months.  She has not taken anything for this.  She states she wears converses normally.  She denies numbness or tingling.  She denies injury of the foot.  She describes the pain at the base of her toes as a cramping.  She states she has a history of ingrown toenails before.  She denies drainage from around the toenail. Additionally, patient reporting lesion of her buttock which appeared yesterday.  She thought it was a pimple, but is continued to be painful.  Nothing has drained from this.  She denies history of similar.  She is not on blood  thinners, does not when her last tetanus was.  HPI  Past Medical History:  Diagnosis Date  . Anemia   . Anxiety   . Asthma   . Depression    No meds. currently. Pt. stopped taking  . Fatigue   . Iron deficiency   . Loss of appetite   . Pneumonia     There are no active problems to display for this patient.   Past Surgical History:  Procedure Laterality Date  . CESAREAN SECTION    . TUBAL LIGATION      OB History    Gravida Para Term Preterm AB Living   6 5 5   1 5    SAB TAB Ectopic Multiple Live Births   1     1         Home Medications    Prior to Admission medications   Medication Sig Start Date End Date Taking? Authorizing Provider  amoxicillin (AMOXIL) 500 MG capsule Take 1 capsule (500 mg total) by mouth 3 (three) times daily. 10/14/16   Mackuen, Courteney Lyn, MD  doxycycline (VIBRAMYCIN) 100 MG capsule Take 1 capsule (100 mg total) 2 (two) times daily for 7 days by mouth. 05/30/17 06/06/17  Eagan Shifflett, PA-C  HYDROcodone-acetaminophen (NORCO/VICODIN) 5-325 MG tablet Take 1 tablet by mouth every 6 (six) hours as needed for severe pain. 03/04/16   Zadie RhineWickline, Donald, MD  HYDROcodone-acetaminophen (NORCO/VICODIN) 5-325 MG tablet Take 1-2 tablets by  mouth every 6 (six) hours as needed for moderate pain. 04/07/17   Vanetta Mulders, MD  metroNIDAZOLE (FLAGYL) 500 MG tablet Take 1 tablet (500 mg total) by mouth 2 (two) times daily. 05/30/15   Cheri Fowler, PA-C  metroNIDAZOLE (METROGEL VAGINAL) 0.75 % vaginal gel Place 1 Applicatorful daily for 5 days vaginally. 05/30/17 06/04/17  Raybon Conard, PA-C  naproxen (NAPROSYN) 500 MG tablet Take 1 tablet (500 mg total) 2 (two) times daily with a meal for 10 days by mouth. 05/30/17 06/09/17  Yaasir Menken, PA-C  promethazine (PHENERGAN) 25 MG tablet Take 1 tablet (25 mg total) by mouth every 6 (six) hours as needed for nausea or vomiting. 04/07/17   Vanetta Mulders, MD    Family History No family history on  file.  Social History Social History   Tobacco Use  . Smoking status: Current Every Day Smoker    Packs/day: 1.00    Types: Cigarettes  . Smokeless tobacco: Never Used  Substance Use Topics  . Alcohol use: Yes    Alcohol/week: 3.6 oz    Types: 6 Cans of beer per week    Comment: 2 (40oz) bottles per day  . Drug use: No     Allergies   Percocet [oxycodone-acetaminophen]; Tramadol; and Ultram [tramadol hcl]   Review of Systems Review of Systems  Constitutional: Negative for chills and fever.  HENT: Negative for congestion and sore throat.   Respiratory: Negative for cough, chest tightness and shortness of breath.   Cardiovascular: Negative for chest pain.  Gastrointestinal: Positive for abdominal pain, diarrhea and nausea. Negative for vomiting.  Genitourinary: Positive for vaginal discharge. Negative for dysuria, frequency and hematuria.  Musculoskeletal: Positive for arthralgias. Negative for back pain.  Skin:       Buttock lesion  Allergic/Immunologic: Negative for immunocompromised state.  Neurological: Negative for headaches.  Hematological: Does not bruise/bleed easily.     Physical Exam Updated Vital Signs BP 135/87   Pulse 84   Temp 98.3 F (36.8 C) (Oral)   Resp 16   Ht 5\' 7"  (1.702 m)   Wt 72.6 kg (160 lb)   SpO2 100%   BMI 25.06 kg/m   Physical Exam  Constitutional: She is oriented to person, place, and time. She appears well-developed and well-nourished. No distress.  HENT:  Head: Normocephalic and atraumatic.  Eyes: EOM are normal.  Neck: Normal range of motion.  Cardiovascular: Normal rate, regular rhythm and intact distal pulses.  Pulmonary/Chest: Effort normal and breath sounds normal. No respiratory distress. She has no wheezes.  Abdominal: Soft. She exhibits no distension and no mass. There is no tenderness. There is no rebound and no guarding.  No visible tenderness to palpation of abdomen.  Abdomen is nondistended and without rigidity or  guarding.  Genitourinary: Rectum normal and uterus normal. Pelvic exam was performed with patient supine. There is no rash, tenderness or lesion on the right labia. There is no rash, tenderness or lesion on the left labia. Cervix exhibits discharge. Cervix exhibits no motion tenderness and no friability. Right adnexum displays no mass, no tenderness and no fullness. Left adnexum displays no mass, no tenderness and no fullness. No erythema, tenderness or bleeding in the vagina. No foreign body in the vagina. No signs of injury around the vagina. No vaginal discharge found.  Genitourinary Comments: Chaperone present.  Thick white discharge noted.  No increased tenderness with cervical motion.  Musculoskeletal: Normal range of motion.  Mild tenderness to palpation at the plantar base of toes  2 and 3 of the left foot.  No obvious swelling, deformity, or injury.  Tenderness of the medial left great toenail, no obvious abnormality.  Pedal pulses intact bilaterally.  Sensation intact bilaterally.  Full active range of motion of ankle toes without difficulty.  Patient is ambulatory.  Neurological: She is alert and oriented to person, place, and time.  Skin: Skin is warm and dry.  Small (~0.5) tender indurated lesion of the left buttock without drainage or surrounding cellulitis.  Psychiatric: She has a normal mood and affect.  Nursing note and vitals reviewed.    ED Treatments / Results  Labs (all labs ordered are listed, but only abnormal results are displayed) Labs Reviewed  WET PREP, GENITAL - Abnormal; Notable for the following components:      Result Value   Clue Cells Wet Prep HPF POC PRESENT (*)    All other components within normal limits  URINALYSIS, ROUTINE W REFLEX MICROSCOPIC - Abnormal; Notable for the following components:   Color, Urine STRAW (*)    Specific Gravity, Urine <1.005 (*)    All other components within normal limits  PREGNANCY, URINE  GC/CHLAMYDIA PROBE AMP ([place])  NOT AT [facility]    EKG  EKG Interpretation None       Radiology No results found.  Procedures Procedures (including critical care time)  Medications Ordered in ED Medications - No data to display   Initial Impression / Assessment and Plan / ED Course  I have reviewed the triage vital signs and the nursing notes.  Pertinent labs & imaging results that were available during my care of the patient were reviewed by me and considered in my medical decision making (see chart for details).     Patient presenting for evaluation of multiple complaints.  Biggest concern is abdominal pain and vaginal odor.  Skull exam reassuring, patient is afebrile not tachycardic.  No visible tenderness, and abdominal exam benign.  CT last month without acute abnormality.  As symptoms are similar, do not believe we need further imaging.  Pelvic performed, positive for clue cells.  Will treat with MetroGel.  Gonorrhea and chlamydia sent.  No signs of CMT or PID.  UA negative for infection, shows patient is dehydrated.  Discussed importance of hydration for suprapubic discomfort. Patient presented to pain.  She is neurovascularly intact.  No obvious deformities or defects.  No signs of infection.  Will treat with NSAIDs and have patient follow-up with podiatry. Patient's buttock lesion is small and indurated.  Discussed option of I&D.  Patient elects not to do so today, and elects to try antibiotics and warm compress.  Will give doxycycline and have patient follow-up with primary care symptoms return or worsen. At this time, patient appears safe for discharge.  Stressed importance of primary care for further evaluation of all her symptoms.  Patient to follow-up with podiatry as needed.  Return precautions given.  Patient states he understands and agrees to plan.   Final Clinical Impressions(s) / ED Diagnoses   Final diagnoses:  BV (bacterial vaginosis)  Pain of toe of left foot  Boil of buttock    ED  Discharge Orders        Ordered    doxycycline (VIBRAMYCIN) 100 MG capsule  2 times daily     05/30/17 1644    naproxen (NAPROSYN) 500 MG tablet  2 times daily with meals     05/30/17 1644    metroNIDAZOLE (METROGEL VAGINAL) 0.75 % vaginal gel  Daily  05/30/17 1644       Alveria ApleyCaccavale, Mister Krahenbuhl, PA-C 05/30/17 1739    Charlynne PanderYao, David Hsienta, MD 05/30/17 2330

## 2017-05-30 NOTE — ED Triage Notes (Signed)
Vaginal discharge x 3 days 

## 2017-05-30 NOTE — Discharge Instructions (Signed)
Take all medications as prescribed. It is important that you stay well-hydrated.  You should try and increase the amount of water you are drinking. Use a warm compress at least twice a day to improve your buttock pain. Make sure you are wearing shoes with good arch support. Follow-up with your primary care for further evaluation of your symptoms. Follow-up with podiatry for further evaluation of your foot pain. Return to the emergency room if you develop persistent high fevers, persistent vomiting, or any new or worsening symptoms.

## 2017-06-02 LAB — GC/CHLAMYDIA PROBE AMP (~~LOC~~) NOT AT ARMC
Chlamydia: NEGATIVE
Neisseria Gonorrhea: NEGATIVE

## 2017-07-19 ENCOUNTER — Encounter (HOSPITAL_BASED_OUTPATIENT_CLINIC_OR_DEPARTMENT_OTHER): Payer: Self-pay | Admitting: *Deleted

## 2017-07-19 ENCOUNTER — Emergency Department (HOSPITAL_BASED_OUTPATIENT_CLINIC_OR_DEPARTMENT_OTHER): Payer: Medicaid Other

## 2017-07-19 ENCOUNTER — Emergency Department (HOSPITAL_BASED_OUTPATIENT_CLINIC_OR_DEPARTMENT_OTHER)
Admission: EM | Admit: 2017-07-19 | Discharge: 2017-07-19 | Disposition: A | Discharge status: Home / Self Care | Payer: Medicaid Other | Attending: Emergency Medicine | Admitting: Emergency Medicine

## 2017-07-19 ENCOUNTER — Other Ambulatory Visit: Payer: Self-pay

## 2017-07-19 DIAGNOSIS — J45909 Unspecified asthma, uncomplicated: Secondary | ICD-10-CM | POA: Diagnosis not present

## 2017-07-19 DIAGNOSIS — N898 Other specified noninflammatory disorders of vagina: Secondary | ICD-10-CM | POA: Insufficient documentation

## 2017-07-19 DIAGNOSIS — R1030 Lower abdominal pain, unspecified: Secondary | ICD-10-CM | POA: Diagnosis present

## 2017-07-19 DIAGNOSIS — R51 Headache: Secondary | ICD-10-CM | POA: Insufficient documentation

## 2017-07-19 DIAGNOSIS — R6883 Chills (without fever): Secondary | ICD-10-CM | POA: Insufficient documentation

## 2017-07-19 DIAGNOSIS — M791 Myalgia, unspecified site: Secondary | ICD-10-CM | POA: Diagnosis not present

## 2017-07-19 DIAGNOSIS — F17210 Nicotine dependence, cigarettes, uncomplicated: Secondary | ICD-10-CM | POA: Insufficient documentation

## 2017-07-19 DIAGNOSIS — R110 Nausea: Secondary | ICD-10-CM | POA: Diagnosis not present

## 2017-07-19 DIAGNOSIS — R519 Headache, unspecified: Secondary | ICD-10-CM

## 2017-07-19 LAB — CBC WITH DIFFERENTIAL/PLATELET
BASOS ABS: 0 10*3/uL (ref 0.0–0.1)
Basophils Relative: 1 %
EOS ABS: 0 10*3/uL (ref 0.0–0.7)
EOS PCT: 1 %
HCT: 44.4 % (ref 36.0–46.0)
Hemoglobin: 15.6 g/dL — ABNORMAL HIGH (ref 12.0–15.0)
Lymphocytes Relative: 47 %
Lymphs Abs: 2 10*3/uL (ref 0.7–4.0)
MCH: 32.2 pg (ref 26.0–34.0)
MCHC: 35.1 g/dL (ref 30.0–36.0)
MCV: 91.7 fL (ref 78.0–100.0)
Monocytes Absolute: 0.7 10*3/uL (ref 0.1–1.0)
Monocytes Relative: 18 %
NEUTROS PCT: 33 %
Neutro Abs: 1.4 10*3/uL — ABNORMAL LOW (ref 1.7–7.7)
PLATELETS: 227 10*3/uL (ref 150–400)
RBC: 4.84 MIL/uL (ref 3.87–5.11)
RDW: 13.4 % (ref 11.5–15.5)
WBC: 4.1 10*3/uL (ref 4.0–10.5)

## 2017-07-19 LAB — COMPREHENSIVE METABOLIC PANEL
ALT: 25 U/L (ref 14–54)
AST: 33 U/L (ref 15–41)
Albumin: 4.1 g/dL (ref 3.5–5.0)
Alkaline Phosphatase: 102 U/L (ref 38–126)
Anion gap: 9 (ref 5–15)
BILIRUBIN TOTAL: 0.4 mg/dL (ref 0.3–1.2)
BUN: 8 mg/dL (ref 6–20)
CO2: 25 mmol/L (ref 22–32)
Calcium: 8.7 mg/dL — ABNORMAL LOW (ref 8.9–10.3)
Chloride: 103 mmol/L (ref 101–111)
Creatinine, Ser: 0.8 mg/dL (ref 0.44–1.00)
Glucose, Bld: 82 mg/dL (ref 65–99)
POTASSIUM: 3.6 mmol/L (ref 3.5–5.1)
Sodium: 137 mmol/L (ref 135–145)
TOTAL PROTEIN: 8.4 g/dL — AB (ref 6.5–8.1)

## 2017-07-19 LAB — WET PREP, GENITAL
CLUE CELLS WET PREP: NONE SEEN
Sperm: NONE SEEN
TRICH WET PREP: NONE SEEN
Yeast Wet Prep HPF POC: NONE SEEN

## 2017-07-19 LAB — URINALYSIS, ROUTINE W REFLEX MICROSCOPIC
Bilirubin Urine: NEGATIVE
Glucose, UA: NEGATIVE mg/dL
Hgb urine dipstick: NEGATIVE
Ketones, ur: NEGATIVE mg/dL
LEUKOCYTES UA: NEGATIVE
NITRITE: NEGATIVE
PROTEIN: 30 mg/dL — AB
Specific Gravity, Urine: >1.03 — ABNORMAL HIGH (ref 1.005–1.030)
pH: 6 (ref 5.0–8.0)

## 2017-07-19 LAB — URINALYSIS, MICROSCOPIC (REFLEX): WBC, UA: NONE SEEN WBC/hpf (ref 0–5)

## 2017-07-19 LAB — PREGNANCY, URINE: Preg Test, Ur: NEGATIVE

## 2017-07-19 LAB — LIPASE, BLOOD: LIPASE: 26 U/L (ref 11–51)

## 2017-07-19 MED ORDER — DIPHENHYDRAMINE HCL 50 MG/ML IJ SOLN: 12.5000 mg | Freq: Once | INTRAMUSCULAR | Status: DC

## 2017-07-19 MED ORDER — PHENAZOPYRIDINE HCL 200 MG PO TABS: 200.0000 mg | ORAL_TABLET | Freq: Three times a day (TID) | ORAL | 0 refills | Status: AC

## 2017-07-19 MED ORDER — NITROFURANTOIN MONOHYD MACRO 100 MG PO CAPS: 100.0000 mg | ORAL_CAPSULE | Freq: Two times a day (BID) | ORAL | 0 refills | Status: AC

## 2017-07-19 MED ORDER — DIPHENHYDRAMINE HCL 50 MG/ML IJ SOLN
25.0000 mg | Freq: Once | INTRAMUSCULAR | Status: AC
  Administered 2017-07-19: 25 mg via INTRAVENOUS
  Filled 2017-07-19: qty 1

## 2017-07-19 MED ORDER — SODIUM CHLORIDE 0.9 % IV BOLUS (SEPSIS)
1000.0000 mL | Freq: Once | INTRAVENOUS | Status: AC
  Administered 2017-07-19: 1000 mL via INTRAVENOUS

## 2017-07-19 MED ORDER — PROCHLORPERAZINE EDISYLATE 5 MG/ML IJ SOLN
10.0000 mg | Freq: Once | INTRAMUSCULAR | Status: AC
  Administered 2017-07-19: 10 mg via INTRAVENOUS
  Filled 2017-07-19: qty 2

## 2017-07-19 MED ORDER — KETOROLAC TROMETHAMINE 30 MG/ML IJ SOLN
15.0000 mg | Freq: Once | INTRAMUSCULAR | Status: AC
  Administered 2017-07-19: 15 mg via INTRAVENOUS
  Filled 2017-07-19: qty 1

## 2017-07-19 MED ORDER — METOCLOPRAMIDE HCL 5 MG/ML IJ SOLN
10.0000 mg | Freq: Once | INTRAMUSCULAR | Status: DC
  Filled 2017-07-19: qty 2

## 2017-07-19 NOTE — ED Provider Notes (Signed)
[facility] EMERGENCY DEPARTMENT Provider Note   CSN: [id number] Arrival date & time: 07/19/17  0856     History   Chief Complaint Chief Complaint  Patient presents with  . Abdominal Pain    HPI Rhonda Whitaker is a 32 y.o. female.  HPI 32 year old African-American female with no pertinent past medical history presents to the emergency department today with complaints of lower abdominal pressure and painful urination.  Patient states for the past 3-4 days she has had pressure over her bladder when she urinates.  She denies any associated dysuria, urgency or frequency.  Patient also reports some vaginal itching but denies any associated discharge or bleeding.  Patient is sexually active with one female partner and has no concern for an STD.  Patient also reports generalized body aches, chills, headache.  She reports a headache for 3 days that was a gradual in onset.  Denies any associated vision changes, lightheadedness, dizziness.  Denies any associated fevers or neck stiffness.  Patient denies any photophobia or vomiting.  Patient denies any associated sick contacts.  She denies any associated change in bowel habits including diarrhea, emesis.  Does report some nausea.  She is been taking Advil Cold and Sinus with only minimal relief of her symptoms.  Nothing makes better or worse.  Patient also reports some dyspareunia.  Pt denies any fever, vision changes, lightheadedness, dizziness, congestion, neck pain, cp, sob, cough, v/d, change in bowel habits, melena, hematochezia, lower extremity paresthesias.  Past Medical History:  Diagnosis Date  . Anemia   . Anxiety   . Asthma   . Depression    No meds. currently. Pt. stopped taking  . Fatigue   . Iron deficiency   . Loss of appetite   . Pneumonia     There are no active problems to display for this patient.   Past Surgical History:  Procedure Laterality Date  . CESAREAN SECTION    . TUBAL LIGATION      OB  History    Gravida Para Term Preterm AB Living   6 5 5   1 5    SAB TAB Ectopic Multiple Live Births   1     1         Home Medications    Prior to Admission medications   Medication Sig Start Date End Date Taking? Authorizing Provider  amoxicillin (AMOXIL) 500 MG capsule Take 1 capsule (500 mg total) by mouth 3 (three) times daily. 10/14/16   Mackuen, Courteney Lyn, MD  HYDROcodone-acetaminophen (NORCO/VICODIN) 5-325 MG tablet Take 1 tablet by mouth every 6 (six) hours as needed for severe pain. 03/04/16   Zadie Rhine, MD  HYDROcodone-acetaminophen (NORCO/VICODIN) 5-325 MG tablet Take 1-2 tablets by mouth every 6 (six) hours as needed for moderate pain. 04/07/17   Vanetta Mulders, MD  metroNIDAZOLE (FLAGYL) 500 MG tablet Take 1 tablet (500 mg total) by mouth 2 (two) times daily. 05/30/15   Cheri Fowler, PA-C  promethazine (PHENERGAN) 25 MG tablet Take 1 tablet (25 mg total) by mouth every 6 (six) hours as needed for nausea or vomiting. 04/07/17   Vanetta Mulders, MD    Family History No family history on file.  Social History Social History   Tobacco Use  . Smoking status: Current Every Day Smoker    Packs/day: 1.00    Types: Cigarettes  . Smokeless tobacco: Never Used  Substance Use Topics  . Alcohol use: Yes    Alcohol/week: 3.6 oz    Types: 6  Cans of beer per week    Comment: 2 (40oz) bottles per day  . Drug use: No     Allergies   Percocet [oxycodone-acetaminophen]; Tramadol; and Ultram [tramadol hcl]   Review of Systems Review of Systems  Constitutional: Positive for chills. Negative for fever.  HENT: Negative for congestion.   Eyes: Negative for visual disturbance.  Respiratory: Negative for cough and shortness of breath.   Cardiovascular: Negative for chest pain.  Gastrointestinal: Positive for abdominal pain and nausea. Negative for blood in stool, diarrhea and vomiting.  Genitourinary: Positive for pelvic pain. Negative for dysuria, flank pain, frequency,  hematuria, urgency, vaginal bleeding and vaginal discharge.  Musculoskeletal: Positive for myalgias. Negative for arthralgias, neck pain and neck stiffness.  Skin: Negative for rash.  Neurological: Positive for headaches. Negative for dizziness, syncope, weakness, light-headedness and numbness.  Psychiatric/Behavioral: Negative for sleep disturbance. The patient is not nervous/anxious.      Physical Exam Updated Vital Signs BP (!) 144/104 (BP Location: Right Arm)   Pulse 96   Temp 98.6 F (37 C) (Oral)   Resp 20   Ht 5\' 7"  (1.702 m)   Wt 93 kg (205 lb)   SpO2 100%   BMI 32.11 kg/m   Physical Exam  Constitutional: She is oriented to person, place, and time. She appears well-developed and well-nourished.  Non-toxic appearance. No distress.  HENT:  Head: Normocephalic and atraumatic.  Nose: Nose normal.  Mouth/Throat: Oropharynx is clear and moist.  Eyes: Conjunctivae are normal. Pupils are equal, round, and reactive to light. Right eye exhibits no discharge. Left eye exhibits no discharge.  Neck: Normal range of motion. Neck supple.  No c spine midline tenderness. No paraspinal tenderness. No deformities or step offs noted. Full ROM. Supple. No nuchal rigidity.    Cardiovascular: Normal rate, regular rhythm, normal heart sounds and intact distal pulses. Exam reveals no gallop and no friction rub.  No murmur heard. Pulmonary/Chest: Effort normal and breath sounds normal. No stridor. No respiratory distress. She has no wheezes. She has no rales. She exhibits no tenderness.  Abdominal: Soft. Bowel sounds are normal. There is no tenderness. There is no rigidity, no rebound, no guarding, no CVA tenderness, no tenderness at McBurney's point and negative Murphy's sign.  Genitourinary:  Genitourinary Comments: Chaperone present for exam. No external lesions, swelling, erythema, or rash of the labia. No erythema, discharge, bleeding, or lesions noted in the vaginal vault. No CMT tenderness,  bleeding or friability. Mild left adnexal tenderness.  No right adnexal tenderness no mass or fullness bilaterally. No inguinal adenopathy or hernia.    Musculoskeletal: Normal range of motion. She exhibits no tenderness.  Lymphadenopathy:    She has no cervical adenopathy.  Neurological: She is alert and oriented to person, place, and time.  The patient is alert, attentive, and oriented x 3. Speech is clear. Cranial nerve II-VII grossly intact. Negative pronator drift. Sensation intact. Strength 5/5 in all extremities. Reflexes 2+ and symmetric at biceps, triceps, knees, and ankles. Rapid alternating movement and fine finger movements intact. Romberg is absent. Posture and gait normal.   Skin: Skin is warm and dry. Capillary refill takes less than 2 seconds.  Psychiatric: Her behavior is normal. Judgment and thought content normal.  Nursing note and vitals reviewed.    ED Treatments / Results  Labs (all labs ordered are listed, but only abnormal results are displayed) Labs Reviewed  WET PREP, GENITAL - Abnormal; Notable for the following components:  Result Value   WBC, Wet Prep HPF POC MODERATE (*)    All other components within normal limits  COMPREHENSIVE METABOLIC PANEL - Abnormal; Notable for the following components:   Calcium 8.7 (*)    Total Protein 8.4 (*)    All other components within normal limits  CBC WITH DIFFERENTIAL/PLATELET - Abnormal; Notable for the following components:   Hemoglobin 15.6 (*)    Neutro Abs 1.4 (*)    All other components within normal limits  URINALYSIS, ROUTINE W REFLEX MICROSCOPIC - Abnormal; Notable for the following components:   Color, Urine AMBER (*)    Specific Gravity, Urine >1.030 (*)    Protein, ur 30 (*)    All other components within normal limits  URINALYSIS, MICROSCOPIC (REFLEX) - Abnormal; Notable for the following components:   Bacteria, UA MANY (*)    Squamous Epithelial / LPF 6-30 (*)    All other components within  normal limits  LIPASE, BLOOD  PREGNANCY, URINE  GC/CHLAMYDIA PROBE AMP ([place]) NOT AT [facility]    EKG  EKG Interpretation None       Radiology No results found.  Procedures Procedures (including critical care time)  Medications Ordered in ED Medications  sodium chloride 0.9 % bolus 1,000 mL (0 mLs Intravenous Stopped 07/19/17 1207)  diphenhydrAMINE (BENADRYL) injection 25 mg (25 mg Intravenous Given 07/19/17 1028)  prochlorperazine (COMPAZINE) injection 10 mg (10 mg Intravenous Given 07/19/17 1029)  ketorolac (TORADOL) 30 MG/ML injection 15 mg (15 mg Intravenous Given 07/19/17 1029)     Initial Impression / Assessment and Plan / ED Course  I have reviewed the triage vital signs and the nursing notes.  Pertinent labs & imaging results that were available during my care of the patient were reviewed by me and considered in my medical decision making (see chart for details).     Patient presents to the emergency department today with complaints of bladder fullness, vaginal irritation and vague urinary symptoms.  She also reports some generalized body aches and chills but denies any associated fevers, vomiting, change in bowel habits.  She is overall well-appearing and nontoxic.  Vital signs are reassuring.  Patient is afebrile in the ED.  She has no tachycardia or hypotension noted.  Patient is lungs clear to auscultation bilaterally.  No focal abdominal tenderness no signs of peritonitis.  Pelvic exam reveals some mild left adnexal tenderness but no cervical motion tenderness or significant discharge.  Patient lab work is reassuring.  No leukocytosis.  Electrolytes were reassuring and at patient's baseline.  Liver enzymes are reassuring.  Lipase is normal. mild hemoconcentration of patient's hemoglobin of 15.6.  UA has many bacteria but no WBCs.  Patient's wet prep shows moderate WBCs but no signs of clue cells or trichomonas.  Gonorrhea and Chlamydia cultures are  pending.  Patient's ultrasound was unremarkable.  Again patient presents to the ED with vague multiple complaints.  She does have some bladder pressure and urine with vaginal itching but denies any associated discharge.  Patient has history of PID but on exam she has no significant cervical motion tenderness.  She also has no significant vaginal discharge.  Wet prep was unremarkable.  We will not treat for PID at this time.  Patient's urine pregnancy test was negative.  Low suspicion for ectopic.  She has no focal abdominal tenderness.  Low suspicion for appendicitis, diverticulitis, pyelonephritis, cholecystitis, pancreatitis.  Even the many bacteria patient's urine will treat her with Macrobid for UTI.  Patient does  report having a headache that was gradual in onset.  Denies any red flag symptoms.  Patient has no significant comorbidities that would be concerning for SIADH, ICH, meningitis.  Patient given migraine cocktail in the ED with complete resolution in her symptoms.    On repeat assessment patient feels much improved and ready for discharge.  Repeat abdominal exam was benign without any focal signs of peritonitis.  Pt is hemodynamically stable, in NAD, & able to ambulate in the ED. Evaluation does not show pathology that would require ongoing emergent intervention or inpatient treatment. I explained the diagnosis to the patient. Pain has been managed & has no complaints prior to dc. Pt is comfortable with above plan and is stable for discharge at this time. All questions were answered prior to disposition. Strict return precautions for f/u to the ED were discussed. Encouraged follow up with PCP.     Final Clinical Impressions(s) / ED Diagnoses   Final diagnoses:  Lower abdominal pain  Nonintractable headache, unspecified chronicity pattern, unspecified headache type    ED Discharge Orders        Ordered    nitrofurantoin, macrocrystal-monohydrate, (MACROBID) 100 MG capsule  2 times  daily     07/19/17 1158    phenazopyridine (PYRIDIUM) 200 MG tablet  3 times daily     07/19/17 1158       Rise Mu, PA-C 07/19/17 1847    Gwyneth Sprout, MD 07/19/17 2150

## 2017-07-19 NOTE — ED Triage Notes (Addendum)
Patient states she has a 3-4 day history of lower abdominal pain and painful urination.  C/O vaginal itching, headache, chills, sweats and  generalized body aches.

## 2017-07-19 NOTE — Discharge Instructions (Signed)
All of your blood work and imaging has been reassuring.  Unknown cause of your symptoms may be due to a urinary tract infection.  Have given you Macrobid to start taking.  Motrin and Tylenol for fevers and pains.  Follow-up with your primary care doctor or return to the ED with any worsening symptoms.

## 2017-07-21 LAB — GC/CHLAMYDIA PROBE AMP (~~LOC~~) NOT AT ARMC
CHLAMYDIA, DNA PROBE: NEGATIVE
Neisseria Gonorrhea: NEGATIVE

## 2017-09-26 IMAGING — CT CT ANGIO CHEST
2 of 8 series · 19 of 36 positions shown · IV contrast (isovue)
Comparison: 01/30/2015

CLINICAL DATA: Chest pain beginning 3 days ago, worse with
inspiration.

EXAM:
CT ANGIOGRAPHY CHEST WITH CONTRAST
TECHNIQUE: Multidetector CT imaging of the chest was performed using the
standard protocol during bolus administration of intravenous
contrast. Multiplanar CT image reconstructions and MIPs were
obtained to evaluate the vascular anatomy.
CONTRAST:  100 cc Isovue 370 IV

[Series 6: pe thins · axial · 0.61mm/px · z∈[-282,-44]mm · 18 of 268 slices shown]
[im 15/268  lung]
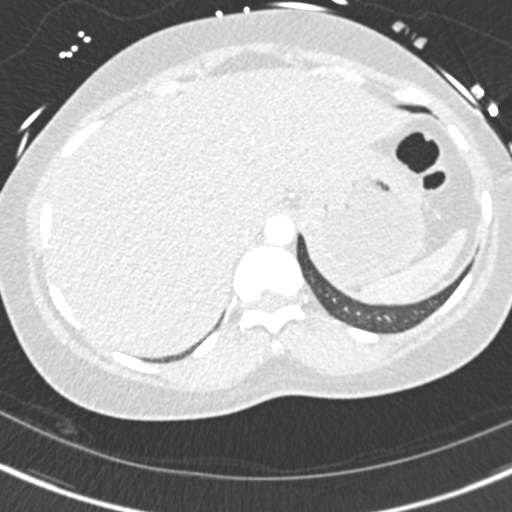
[im 29/268  mediastinal]
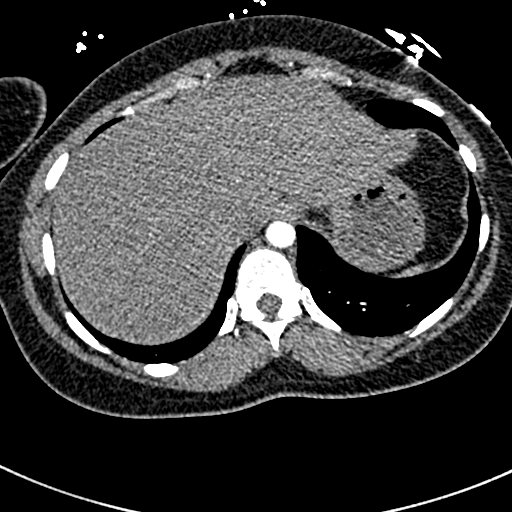
[im 43/268  lung]
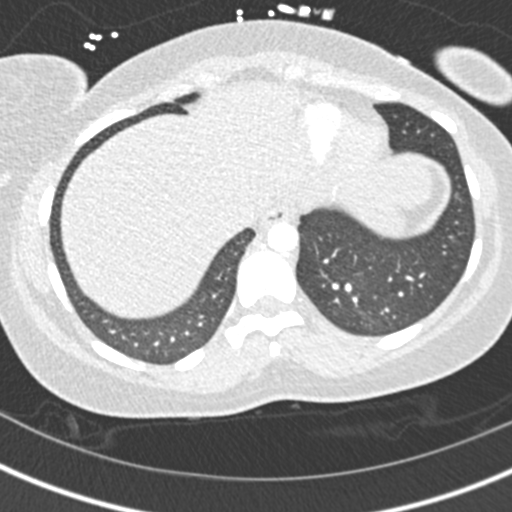
[im 57/268  mediastinal]
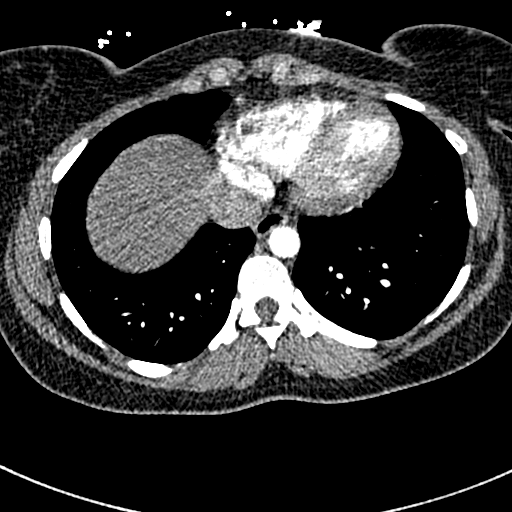
[im 71/268  lung]
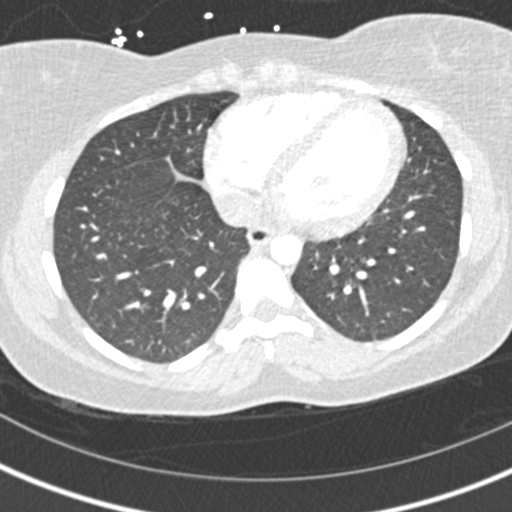
[im 85/268  mediastinal]
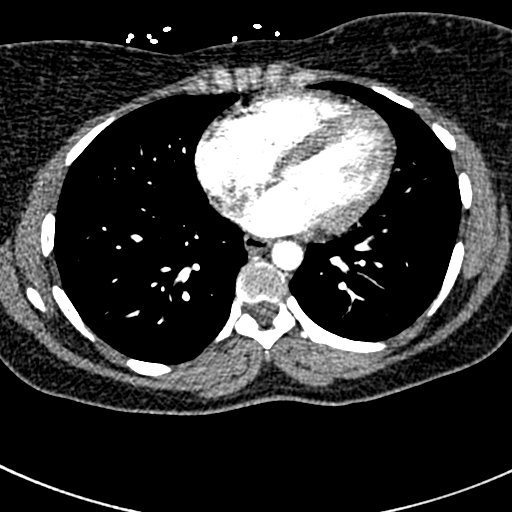
[im 99/268  lung]
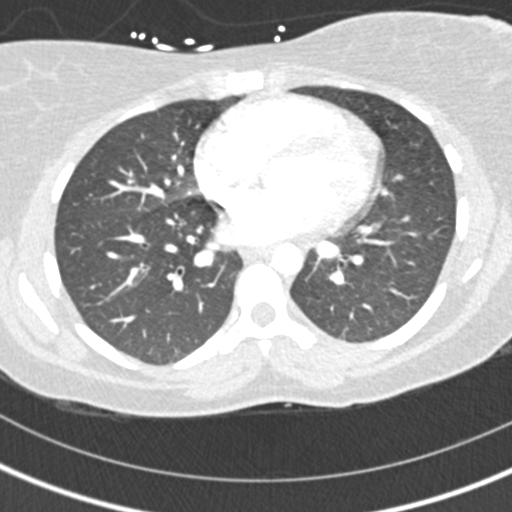
[im 113/268  mediastinal]
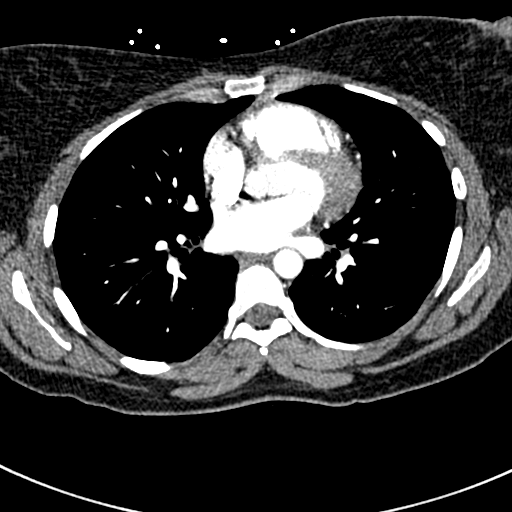
[im 127/268  lung]
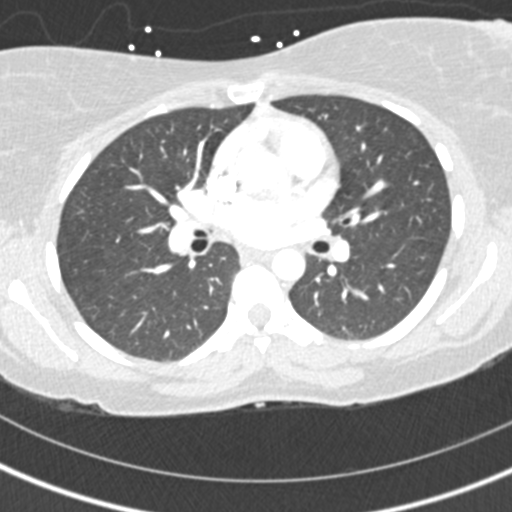
[im 141/268  mediastinal]
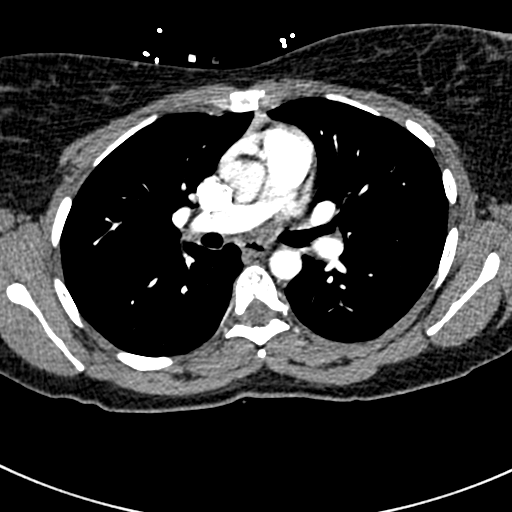
[im 155/268  lung]
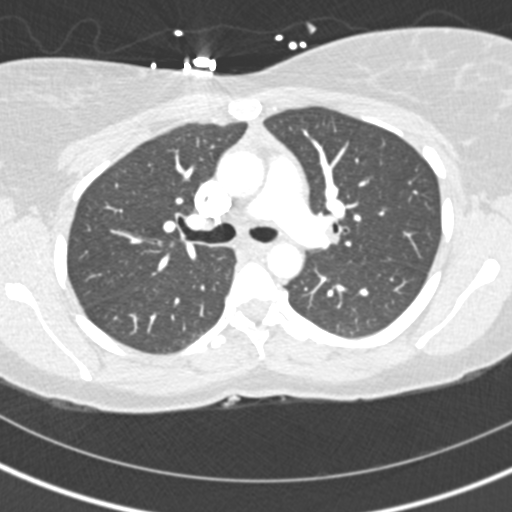
[im 169/268  mediastinal]
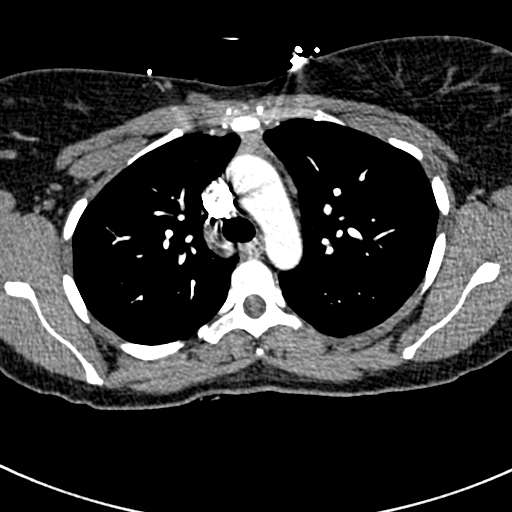
[im 183/268  lung]
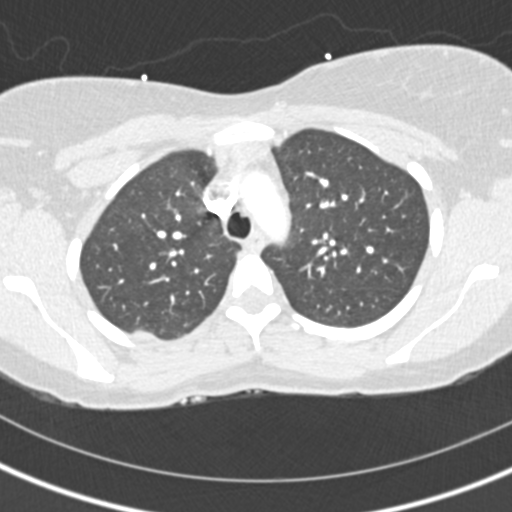
[im 197/268  mediastinal]
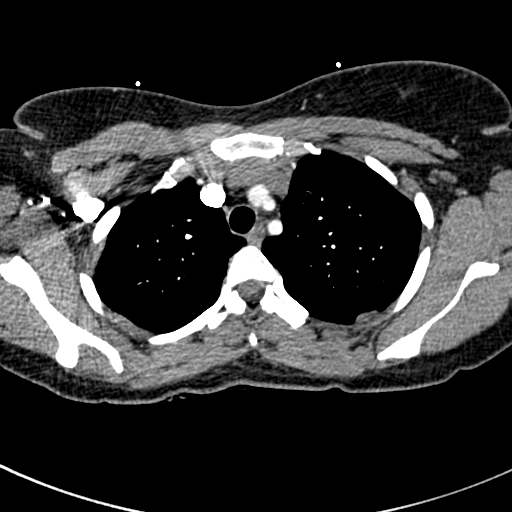
[im 211/268  lung]
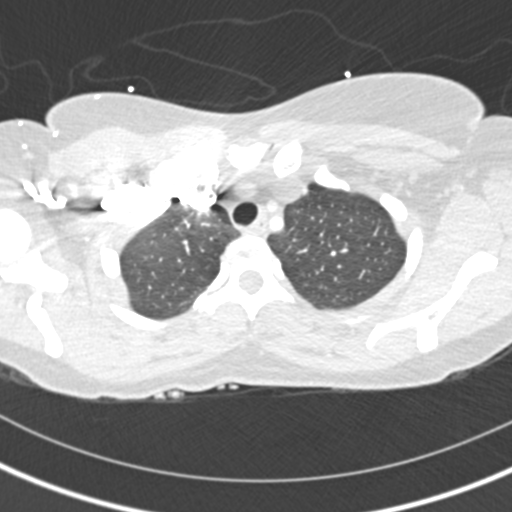
[im 225/268  mediastinal]
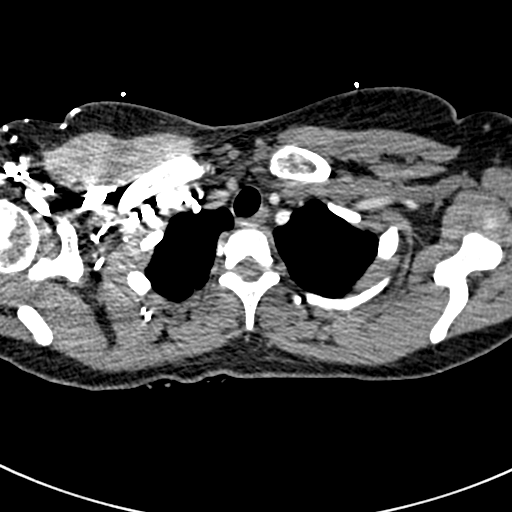
[im 239/268  lung]
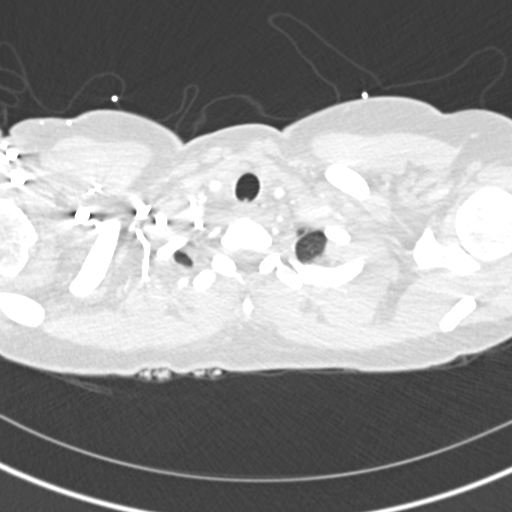
[im 253/268  mediastinal]
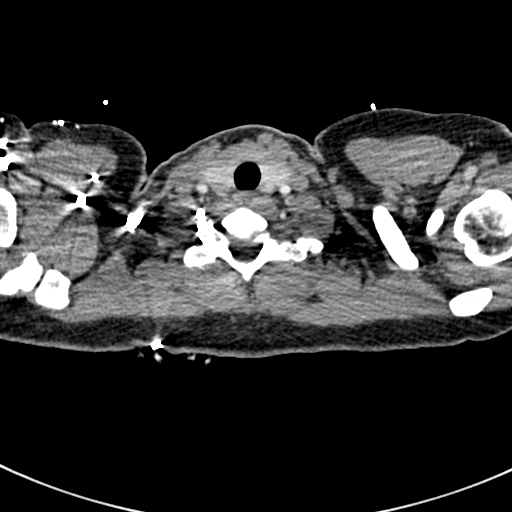

[Series 7: pe coronal mpr · coronal · 0.54mm/px · 1 of 142 slices shown]
[im 71/142  mediastinal]
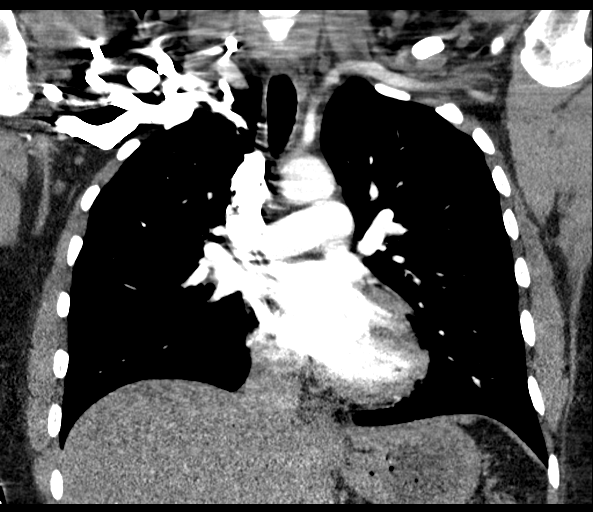

[19 of 36 positions shown; findings below may reference images not displayed]

FINDINGS: Cardiovascular: No filling defects in the pulmonary arteries to
suggest pulmonary emboli. Heart is normal size. Aorta is normal
caliber.

Mediastinum/Nodes: No mediastinal, hilar, or axillary adenopathy.

Lungs/Pleura: Lungs are clear. No focal airspace opacities or
suspicious nodules. No effusions.

Upper Abdomen: Imaging into the upper abdomen shows no acute
findings.

Musculoskeletal: No acute bony abnormality or focal bone lesion.

Review of the MIP images confirms the above findings.
IMPRESSION: No evidence of pulmonary embolus.

No acute cardiopulmonary disease.

## 2018-06-21 ENCOUNTER — Emergency Department (HOSPITAL_BASED_OUTPATIENT_CLINIC_OR_DEPARTMENT_OTHER): Payer: Medicaid Other

## 2018-06-21 ENCOUNTER — Encounter (HOSPITAL_BASED_OUTPATIENT_CLINIC_OR_DEPARTMENT_OTHER): Payer: Self-pay | Admitting: Emergency Medicine

## 2018-06-21 ENCOUNTER — Emergency Department (HOSPITAL_BASED_OUTPATIENT_CLINIC_OR_DEPARTMENT_OTHER)
Admission: EM | Admit: 2018-06-21 | Discharge: 2018-06-21 | Disposition: A | Discharge status: Home / Self Care | Payer: Medicaid Other | Attending: Emergency Medicine | Admitting: Emergency Medicine

## 2018-06-21 ENCOUNTER — Other Ambulatory Visit: Payer: Self-pay

## 2018-06-21 DIAGNOSIS — N898 Other specified noninflammatory disorders of vagina: Secondary | ICD-10-CM

## 2018-06-21 DIAGNOSIS — J45909 Unspecified asthma, uncomplicated: Secondary | ICD-10-CM | POA: Diagnosis not present

## 2018-06-21 DIAGNOSIS — N72 Inflammatory disease of cervix uteri: Secondary | ICD-10-CM | POA: Insufficient documentation

## 2018-06-21 DIAGNOSIS — F419 Anxiety disorder, unspecified: Secondary | ICD-10-CM | POA: Insufficient documentation

## 2018-06-21 DIAGNOSIS — F329 Major depressive disorder, single episode, unspecified: Secondary | ICD-10-CM | POA: Insufficient documentation

## 2018-06-21 DIAGNOSIS — Z79899 Other long term (current) drug therapy: Secondary | ICD-10-CM | POA: Diagnosis not present

## 2018-06-21 DIAGNOSIS — B9689 Other specified bacterial agents as the cause of diseases classified elsewhere: Secondary | ICD-10-CM

## 2018-06-21 DIAGNOSIS — F17210 Nicotine dependence, cigarettes, uncomplicated: Secondary | ICD-10-CM | POA: Insufficient documentation

## 2018-06-21 DIAGNOSIS — N760 Acute vaginitis: Secondary | ICD-10-CM | POA: Insufficient documentation

## 2018-06-21 LAB — CBC WITH DIFFERENTIAL/PLATELET
ABS IMMATURE GRANULOCYTES: 0.02 10*3/uL (ref 0.00–0.07)
BASOS PCT: 0 %
Basophils Absolute: 0 10*3/uL (ref 0.0–0.1)
Eosinophils Absolute: 0.1 10*3/uL (ref 0.0–0.5)
Eosinophils Relative: 1 %
HCT: 43.5 % (ref 36.0–46.0)
HEMOGLOBIN: 14.5 g/dL (ref 12.0–15.0)
IMMATURE GRANULOCYTES: 0 %
LYMPHS PCT: 24 %
Lymphs Abs: 1.6 10*3/uL (ref 0.7–4.0)
MCH: 31.9 pg (ref 26.0–34.0)
MCHC: 33.3 g/dL (ref 30.0–36.0)
MCV: 95.8 fL (ref 80.0–100.0)
MONO ABS: 0.8 10*3/uL (ref 0.1–1.0)
Monocytes Relative: 12 %
NEUTROS ABS: 4.2 10*3/uL (ref 1.7–7.7)
NEUTROS PCT: 63 %
Platelets: 223 10*3/uL (ref 150–400)
RBC: 4.54 MIL/uL (ref 3.87–5.11)
RDW: 13.4 % (ref 11.5–15.5)
WBC: 6.8 10*3/uL (ref 4.0–10.5)
nRBC: 0 % (ref 0.0–0.2)

## 2018-06-21 LAB — WET PREP, GENITAL
SPERM: NONE SEEN
Trich, Wet Prep: NONE SEEN
Yeast Wet Prep HPF POC: NONE SEEN

## 2018-06-21 LAB — URINALYSIS, ROUTINE W REFLEX MICROSCOPIC
Bilirubin Urine: NEGATIVE
GLUCOSE, UA: NEGATIVE mg/dL
Hgb urine dipstick: NEGATIVE
Ketones, ur: NEGATIVE mg/dL
LEUKOCYTES UA: NEGATIVE
Nitrite: NEGATIVE
PROTEIN: NEGATIVE mg/dL
pH: 5.5 (ref 5.0–8.0)

## 2018-06-21 LAB — COMPREHENSIVE METABOLIC PANEL
ALBUMIN: 4 g/dL (ref 3.5–5.0)
ALK PHOS: 85 U/L (ref 38–126)
ALT: 33 U/L (ref 0–44)
AST: 37 U/L (ref 15–41)
Anion gap: 6 (ref 5–15)
BUN: 12 mg/dL (ref 6–20)
CO2: 25 mmol/L (ref 22–32)
CREATININE: 0.76 mg/dL (ref 0.44–1.00)
Calcium: 9.1 mg/dL (ref 8.9–10.3)
Chloride: 107 mmol/L (ref 98–111)
GFR calc non Af Amer: >60 mL/min (ref >60)
GLUCOSE: 93 mg/dL (ref 70–99)
Potassium: 4 mmol/L (ref 3.5–5.1)
SODIUM: 138 mmol/L (ref 135–145)
Total Bilirubin: 0.3 mg/dL (ref 0.3–1.2)
Total Protein: 7.9 g/dL (ref 6.5–8.1)

## 2018-06-21 LAB — MAGNESIUM: Magnesium: 2.2 mg/dL (ref 1.7–2.4)

## 2018-06-21 LAB — TROPONIN I: Troponin I: <0.03 ng/mL (ref <0.03)

## 2018-06-21 LAB — PREGNANCY, URINE: Preg Test, Ur: NEGATIVE

## 2018-06-21 MED ORDER — CEFTRIAXONE SODIUM 250 MG IJ SOLR
250.0000 mg | Freq: Once | INTRAMUSCULAR | Status: AC
  Administered 2018-06-21: 250 mg via INTRAMUSCULAR
  Filled 2018-06-21: qty 250

## 2018-06-21 MED ORDER — CLINDAMYCIN HCL 300 MG PO CAPS: 300.0000 mg | ORAL_CAPSULE | Freq: Two times a day (BID) | ORAL | 0 refills | Status: AC

## 2018-06-21 MED ORDER — LIDOCAINE HCL (PF) 1 % IJ SOLN
INTRAMUSCULAR | Status: AC
  Administered 2018-06-21: 5 mL
  Filled 2018-06-21: qty 5

## 2018-06-21 MED ORDER — AZITHROMYCIN 1 G PO PACK
1.0000 g | PACK | Freq: Once | ORAL | Status: AC
  Administered 2018-06-21: 1 g via ORAL
  Filled 2018-06-21: qty 1

## 2018-06-21 NOTE — ED Provider Notes (Signed)
[facility] EMERGENCY DEPARTMENT Provider Note   CSN: [id number] Arrival date & time: 06/21/18  4098     History   Chief Complaint Chief Complaint  Patient presents with  . vaginal irritation    HPI Rhonda Whitaker is a 32 y.o. female.  The history is provided by the patient and medical records. No language interpreter was used.  Vaginal Discharge   This is a recurrent problem. The current episode started more than 1 week ago. The problem occurs constantly. The problem has not changed since onset.The discharge was normal. Associated symptoms include abdominal pain, dysuria, genital burning and genital itching. Pertinent negatives include no diaphoresis, no fever, no abdominal swelling, no constipation, no diarrhea, no nausea, no vomiting and no frequency. She has tried nothing for the symptoms. The treatment provided no relief.    Past Medical History:  Diagnosis Date  . Anemia   . Anxiety   . Asthma   . Depression    No meds. currently. Pt. stopped taking  . Fatigue   . Iron deficiency   . Loss of appetite   . Pneumonia     There are no active problems to display for this patient.   Past Surgical History:  Procedure Laterality Date  . CESAREAN SECTION    . TUBAL LIGATION       OB History    Gravida  6   Para  5   Term  5   Preterm      AB  1   Living  5     SAB  1   TAB      Ectopic      Multiple  1   Live Births               Home Medications    Prior to Admission medications   Medication Sig Start Date End Date Taking? Authorizing Provider  amoxicillin (AMOXIL) 500 MG capsule Take 1 capsule (500 mg total) by mouth 3 (three) times daily. 10/14/16   Mackuen, Courteney Lyn, MD  HYDROcodone-acetaminophen (NORCO/VICODIN) 5-325 MG tablet Take 1 tablet by mouth every 6 (six) hours as needed for severe pain. 03/04/16   Zadie Rhine, MD  HYDROcodone-acetaminophen (NORCO/VICODIN) 5-325 MG tablet Take 1-2 tablets by mouth every 6  (six) hours as needed for moderate pain. 04/07/17   Vanetta Mulders, MD  metroNIDAZOLE (FLAGYL) 500 MG tablet Take 1 tablet (500 mg total) by mouth 2 (two) times daily. 05/30/15   Cheri Fowler, PA-C  nitrofurantoin, macrocrystal-monohydrate, (MACROBID) 100 MG capsule Take 1 capsule (100 mg total) by mouth 2 (two) times daily. 07/19/17   Rise Mu, PA-C  phenazopyridine (PYRIDIUM) 200 MG tablet Take 1 tablet (200 mg total) by mouth 3 (three) times daily. 07/19/17   Rise Mu, PA-C  promethazine (PHENERGAN) 25 MG tablet Take 1 tablet (25 mg total) by mouth every 6 (six) hours as needed for nausea or vomiting. 04/07/17   Vanetta Mulders, MD    Family History No family history on file.  Social History Social History   Tobacco Use  . Smoking status: Current Every Day Smoker    Packs/day: 1.00    Types: Cigarettes  . Smokeless tobacco: Never Used  Substance Use Topics  . Alcohol use: Yes    Alcohol/week: 6.0 standard drinks    Types: 6 Cans of beer per week    Comment: 2 (40oz) bottles per day  . Drug use: No     Allergies  Percocet [oxycodone-acetaminophen]; Tramadol; and Ultram [tramadol hcl]   Review of Systems Review of Systems  Constitutional: Positive for fatigue. Negative for chills, diaphoresis and fever.  HENT: Negative for congestion.   Eyes: Negative for visual disturbance.  Respiratory: Positive for chest tightness. Negative for cough, shortness of breath and wheezing.   Cardiovascular: Positive for chest pain. Negative for palpitations and leg swelling.  Gastrointestinal: Positive for abdominal pain. Negative for abdominal distention, constipation, diarrhea, nausea and vomiting.  Genitourinary: Positive for dysuria and vaginal discharge. Negative for flank pain and frequency.  Musculoskeletal: Negative for back pain, neck pain and neck stiffness.  Skin: Negative for rash and wound.  Neurological: Positive for headaches. Negative for dizziness,  seizures, speech difficulty, weakness, light-headedness and numbness.  Psychiatric/Behavioral: Negative for agitation.  All other systems reviewed and are negative.    Physical Exam Updated Vital Signs BP (!) 137/96 (BP Location: Right Arm)   Pulse 85   Temp 98.6 F (37 C) (Oral)   Resp 19   Ht 5\' 7"  (1.702 m)   Wt 82.6 kg   SpO2 100%   BMI 28.51 kg/m   Physical Exam  Constitutional: She is oriented to person, place, and time. She appears well-developed and well-nourished. No distress.  HENT:  Head: Normocephalic and atraumatic.  Nose: Nose normal.  Mouth/Throat: Oropharynx is clear and moist. No oropharyngeal exudate.  Eyes: Pupils are equal, round, and reactive to light. Conjunctivae and EOM are normal.  Neck: Normal range of motion. Neck supple.  Cardiovascular: Normal rate and regular rhythm.  No murmur heard. Pulmonary/Chest: Effort normal and breath sounds normal. No stridor. No respiratory distress. She has no wheezes. She has no rales. She exhibits no tenderness.  Abdominal: Soft. She exhibits no distension. There is tenderness (very mild lower abd). There is no guarding.  Musculoskeletal: She exhibits no edema or tenderness.  Neurological: She is alert and oriented to person, place, and time. No cranial nerve deficit or sensory deficit. She exhibits normal muscle tone. Coordination normal.  Skin: Skin is warm and dry. Capillary refill takes less than 2 seconds. No rash noted. She is not diaphoretic. No erythema.  Psychiatric: She has a normal mood and affect.  Nursing note and vitals reviewed.    ED Treatments / Results  Labs (all labs ordered are listed, but only abnormal results are displayed) Labs Reviewed  WET PREP, GENITAL - Abnormal; Notable for the following components:      Result Value   Clue Cells Wet Prep HPF POC PRESENT (*)    WBC, Wet Prep HPF POC MODERATE (*)    All other components within normal limits  URINALYSIS, ROUTINE W REFLEX MICROSCOPIC -  Abnormal; Notable for the following components:   APPearance HAZY (*)    Specific Gravity, Urine >1.030 (*)    All other components within normal limits  URINE CULTURE  PREGNANCY, URINE  CBC WITH DIFFERENTIAL/PLATELET  COMPREHENSIVE METABOLIC PANEL  MAGNESIUM  TROPONIN I  WET PREP  (BD AFFIRM) ([place])  GC/CHLAMYDIA PROBE AMP ([place]) NOT AT [facility]    EKG EKG Interpretation  Date/Time:  Sunday June 21 2018 07:38:01 EST Ventricular Rate:  79 PR Interval:    QRS Duration: 83 QT Interval:  369 QTC Calculation: 423 R Axis:   119 Text Interpretation:  Sinus rhythm Right axis deviation Probable anteroseptal infarct, old When compared to prior, no significant changes seen.  No STEMI Confirmed by Theda Belfastegeler, Chris ([id number]) on 06/21/2018 8:24:35 AM   Radiology Dg Chest  2 View  Result Date: 06/21/2018 CLINICAL DATA:  Smoker.  Left-sided rib pain. EXAM: CHEST - 2 VIEW COMPARISON:  CT 12/05/2016.  Chest radiograph 05/31/2016 FINDINGS: Midline trachea. Normal heart size and mediastinal contours. No pleural effusion or pneumothorax. Clear lungs. IMPRESSION: No acute cardiopulmonary disease. Electronically Signed   By: Jeronimo Greaves M.D.   On: 06/21/2018 08:10   US Transvaginal Non-ob  Result Date: 06/21/2018 CLINICAL DATA:  Right-sided pain for 2 weeks. Pelvic inflammatory disease. EXAM: TRANSABDOMINAL AND TRANSVAGINAL ULTRASOUND OF PELVIS TECHNIQUE: Both transabdominal and transvaginal ultrasound examinations of the pelvis were performed. Transabdominal technique was performed for global imaging of the pelvis including uterus, ovaries, adnexal regions, and pelvic cul-de-sac. It was necessary to proceed with endovaginal exam following the transabdominal exam to visualize the uterus, ovaries, and adnexa. COMPARISON:  07/19/2017 FINDINGS: Uterus Measurements: 8.2 by 4.1 x 5.3 cm = volume: 94 mL. Nonspecific heterogeneous uterine architecture. Endometrium Thickness: Normal, 3 mm.  No focal  abnormality visualized. Right ovary Measurements: 3.7 x 2.6 x 2.3 cm = volume: 12.0 mL. Demonstrates an avascular, hypoechoic 2.0 x 1.5 x 1.9 cm lesion within. Left ovary Measurements: 3.4 x 2.5 x 2.8 cm = volume: 12.5 mL. Complex cystic structure of 1.5 cm within. More simple appearing 2.0 cm cystic structure. Other findings Trace free pelvic fluid is likely physiologic. IMPRESSION: 1. Avascular hypoechoic lesions in both ovaries are likely complex follicles. 2. No explanation for pain otherwise. Electronically Signed   By: Jeronimo Greaves M.D.   On: 06/21/2018 10:55   US Pelvis Complete  Result Date: 06/21/2018 CLINICAL DATA:  Right-sided pain for 2 weeks. Pelvic inflammatory disease. EXAM: TRANSABDOMINAL AND TRANSVAGINAL ULTRASOUND OF PELVIS TECHNIQUE: Both transabdominal and transvaginal ultrasound examinations of the pelvis were performed. Transabdominal technique was performed for global imaging of the pelvis including uterus, ovaries, adnexal regions, and pelvic cul-de-sac. It was necessary to proceed with endovaginal exam following the transabdominal exam to visualize the uterus, ovaries, and adnexa. COMPARISON:  07/19/2017 FINDINGS: Uterus Measurements: 8.2 by 4.1 x 5.3 cm = volume: 94 mL. Nonspecific heterogeneous uterine architecture. Endometrium Thickness: Normal, 3 mm.  No focal abnormality visualized. Right ovary Measurements: 3.7 x 2.6 x 2.3 cm = volume: 12.0 mL. Demonstrates an avascular, hypoechoic 2.0 x 1.5 x 1.9 cm lesion within. Left ovary Measurements: 3.4 x 2.5 x 2.8 cm = volume: 12.5 mL. Complex cystic structure of 1.5 cm within. More simple appearing 2.0 cm cystic structure. Other findings Trace free pelvic fluid is likely physiologic. IMPRESSION: 1. Avascular hypoechoic lesions in both ovaries are likely complex follicles. 2. No explanation for pain otherwise. Electronically Signed   By: Jeronimo Greaves M.D.   On: 06/21/2018 10:55    Procedures Procedures (including critical care  time)  Medications Ordered in ED Medications  cefTRIAXone (ROCEPHIN) injection 250 mg (250 mg Intramuscular Given 06/21/18 1255)  azithromycin (ZITHROMAX) powder 1 g (1 g Oral Given 06/21/18 1255)  lidocaine (PF) (XYLOCAINE) 1 % injection (5 mLs  Given 06/21/18 1255)     Initial Impression / Assessment and Plan / ED Course  I have reviewed the triage vital signs and the nursing notes.  Pertinent labs & imaging results that were available during my care of the patient were reviewed by me and considered in my medical decision making (see chart for details).     Rhonda Whitaker is a 32 y.o. female with a past medical history of asthma, anemia, and prior pneumonia who presents with multiple complaints including vaginal discharge, vaginal  itching, lower abdominal cramping, pain with urination, and chest tightness.  Patient reports that her symptoms have been ongoing for several months but she reports that she has had worsened vaginal symptoms for the last 2 weeks.  She reports that she saw a OB/GYN last week who diagnosed her with bacterial vaginosis however she reports that after taking Flagyl she was feeling worse in the setting of drinking.  She stopped taking it and incompletely treated her BV.  She reports that she is having tingling in her arms and legs and feels "bad all over".  She denies any nausea, vomiting, constipation, diarrhea, neck pain, neck stiffness, or other complaints.  On exam, abdomen is slightly tender in the lower abdomen.  No flank tenderness or CVA tenderness.  Lungs are clear and chest is nontender.  Abdomen is otherwise nontender.  Normal bowel sounds.  Legs are nontender nonedematous.  Normal sensation strength and coordination with extremities.  No focal neuro deficits.  Due to patient's continued discharge and vaginal irritation, will reobtain swabs and do a pelvic exam.  Patient has tenderness, will consider ultrasound to rule out TOA.  If she is also tender, would  consider treating for PID.  Patient will have screening labs due to her tingling all over to look for electrolyte imbalance.  With her chest pain will have EKG, troponin, and chest x-ray.  Anticipate reassessment after work-up however I suspect patient was stable for discharge home for outpatient management.  9:27 AM On pelvic exam, patient does have significant discharge and some discomfort in the right adnexa and mild cervical tenderness.  Patient will be given antibiotics for cervicitis however will obtain ultrasound to rule out TOA or other abnormality.   Ultrasound showed bilateral follicles but no evidence of torsion or other acute abnormality.  No evidence of TOA.  Labs showed clue cells and white blood cells but no evidence of trichomoniasis.  CBC and metabolic panel reassuring.  Magnesium normal.  Urinalysis shows no UTI.  Chest x-ray shows no pneumonia.  EKG reassuring.  Patient given Rocephin and azithromycin for cervicitis prophylaxis and was also given prescription for Clinda for alternate treatment for BV which she has.  Patient was able to eat and drink and otherwise appears well.  No other concerning symptoms or abnormalities on exam.  Patient was feeling better and agrees with discharge for outpatient follow-up for her other chronic medical problems.  Patient no other questions or concerns and was discharged in good condition.  Final Clinical Impressions(s) / ED Diagnoses   Final diagnoses:  BV (bacterial vaginosis)  Vaginal discharge  Cervicitis    ED Discharge Orders         Ordered    clindamycin (CLEOCIN) 300 MG capsule  2 times daily     06/21/18 1240         Clinical Impression: 1. BV (bacterial vaginosis)   2. Vaginal discharge   3. Cervicitis     Disposition: Discharge  Condition: Good  I have discussed the results, Dx and Tx plan with the pt(& family if present). He/she/they expressed understanding and agree(s) with the plan. Discharge instructions  discussed at great length. Strict return precautions discussed and pt &/or family have verbalized understanding of the instructions. No further questions at time of discharge.    Discharge Medication List as of 06/21/2018 12:43 PM    START taking these medications   Details  clindamycin (CLEOCIN) 300 MG capsule Take 1 capsule (300 mg total) by mouth 2 (two) times  daily for 7 days., Starting Sun 06/21/2018, Until Sun 06/28/2018, Print        Follow Up: Dahlia Bailiff, MD [address] [place] [address] [phone]     [facility] [address] [place] [address] [phone] Schedule an appointment as soon as possible for a visit    [facility] [address] [id number] [address] [place] [address] [phone]       Tegeler, Canary Brim, MD 06/21/18 [phone]

## 2018-06-21 NOTE — Discharge Instructions (Signed)
Your workup today revealed evidence of continued bacterial vaginosis. Will will prescribe th new antibiotic to treat it. We also discovered evidence of cervicitis in your vagina with no evidence of abscess or deeper infection on ultrasound. We gave you two different antibiotics today for this. Please follow up with your PCP and OBGYN for further management. If any symptoms change or worsen, please return to the nearest ED.

## 2018-06-21 NOTE — ED Triage Notes (Addendum)
Reports being seen at her OBGYN last week for vaginal itching & irritation x 1.5 months and dx with BV. States she was prescribed Flagyl and has not taken it because "it makes me so sick". Also states she has lower abd pain, and epigastric pain. Also reports "lumps" under neck, HA x 2 weeks that "eases up, but doesn't go away". C/o numbness and tingling in fingers and toes "that is on-going". She is worried she may have diabetes, has "spots" on her legs. Also states "my chest has been bothering me for a couple days". Pinpoints pain to L ribcage. Reports "bump on tongue" as well.

## 2018-06-22 LAB — GC/CHLAMYDIA PROBE AMP (~~LOC~~) NOT AT ARMC
CHLAMYDIA, DNA PROBE: NEGATIVE
NEISSERIA GONORRHEA: NEGATIVE

## 2018-06-23 LAB — URINE CULTURE: CULTURE: NO GROWTH

## 2019-01-19 ENCOUNTER — Other Ambulatory Visit: Payer: Self-pay

## 2019-01-19 ENCOUNTER — Encounter (HOSPITAL_BASED_OUTPATIENT_CLINIC_OR_DEPARTMENT_OTHER): Payer: Self-pay | Admitting: Emergency Medicine

## 2019-01-19 ENCOUNTER — Emergency Department (HOSPITAL_BASED_OUTPATIENT_CLINIC_OR_DEPARTMENT_OTHER)
Admission: EM | Admit: 2019-01-19 | Discharge: 2019-01-19 | Disposition: A | Discharge status: Home / Self Care | Payer: Medicaid Other | Attending: Emergency Medicine | Admitting: Emergency Medicine

## 2019-01-19 DIAGNOSIS — N899 Noninflammatory disorder of vagina, unspecified: Secondary | ICD-10-CM | POA: Insufficient documentation

## 2019-01-19 DIAGNOSIS — R519 Headache, unspecified: Secondary | ICD-10-CM

## 2019-01-19 DIAGNOSIS — J329 Chronic sinusitis, unspecified: Secondary | ICD-10-CM | POA: Diagnosis not present

## 2019-01-19 DIAGNOSIS — M791 Myalgia, unspecified site: Secondary | ICD-10-CM

## 2019-01-19 DIAGNOSIS — F17210 Nicotine dependence, cigarettes, uncomplicated: Secondary | ICD-10-CM | POA: Insufficient documentation

## 2019-01-19 DIAGNOSIS — N898 Other specified noninflammatory disorders of vagina: Secondary | ICD-10-CM

## 2019-01-19 DIAGNOSIS — Z20828 Contact with and (suspected) exposure to other viral communicable diseases: Secondary | ICD-10-CM | POA: Insufficient documentation

## 2019-01-19 DIAGNOSIS — N760 Acute vaginitis: Secondary | ICD-10-CM | POA: Insufficient documentation

## 2019-01-19 DIAGNOSIS — J45909 Unspecified asthma, uncomplicated: Secondary | ICD-10-CM | POA: Diagnosis not present

## 2019-01-19 DIAGNOSIS — B9689 Other specified bacterial agents as the cause of diseases classified elsewhere: Secondary | ICD-10-CM

## 2019-01-19 LAB — URINALYSIS, MICROSCOPIC (REFLEX)

## 2019-01-19 LAB — URINALYSIS, ROUTINE W REFLEX MICROSCOPIC
Bilirubin Urine: NEGATIVE
Glucose, UA: NEGATIVE mg/dL
Ketones, ur: NEGATIVE mg/dL
Leukocytes,Ua: NEGATIVE
Nitrite: NEGATIVE
Protein, ur: NEGATIVE mg/dL
Specific Gravity, Urine: >1.03 — ABNORMAL HIGH (ref 1.005–1.030)
pH: 5.5 (ref 5.0–8.0)

## 2019-01-19 LAB — WET PREP, GENITAL
Trich, Wet Prep: NONE SEEN
Yeast Wet Prep HPF POC: NONE SEEN

## 2019-01-19 LAB — PREGNANCY, URINE: Preg Test, Ur: NEGATIVE

## 2019-01-19 MED ORDER — METRONIDAZOLE 0.75 % VA GEL: 1.0000 | Freq: Every day | VAGINAL | 0 refills | Status: AC

## 2019-01-19 NOTE — ED Provider Notes (Signed)
[facility] EMERGENCY DEPARTMENT Provider Note  CSN: [id number] Arrival date & time: 01/19/19 0159  Chief Complaint(s) Generalized Body Aches  HPI Rhonda Whitaker is a 33 y.o. female recently treated for BV here for continued foul vaginal odor. She also reports vaginal spotting, but reports h/o ablation. She denies abd pain. She also reports sinus congestion and frontal headache with myalgias, intermittent NBNB emesis and loose stools for 2-3 weeks. She also reports loss of smell and taste. No fevers or chills. No sick contacts.   The history is provided by the patient.    Past Medical History Past Medical History:  Diagnosis Date  . Anemia   . Anxiety   . Asthma   . Depression    No meds. currently. Pt. stopped taking  . Fatigue   . Iron deficiency   . Loss of appetite   . Pneumonia    There are no active problems to display for this patient.  Home Medication(s) Prior to Admission medications   Medication Sig Start Date End Date Taking? Authorizing Provider  amoxicillin (AMOXIL) 500 MG capsule Take 1 capsule (500 mg total) by mouth 3 (three) times daily. 10/14/16   Mackuen, Courteney Lyn, MD  HYDROcodone-acetaminophen (NORCO/VICODIN) 5-325 MG tablet Take 1 tablet by mouth every 6 (six) hours as needed for severe pain. 03/04/16   Ripley Fraise, MD  HYDROcodone-acetaminophen (NORCO/VICODIN) 5-325 MG tablet Take 1-2 tablets by mouth every 6 (six) hours as needed for moderate pain. 04/07/17   Fredia Sorrow, MD  metroNIDAZOLE (FLAGYL) 500 MG tablet Take 1 tablet (500 mg total) by mouth 2 (two) times daily. 05/30/15   Gloriann Loan, PA-C  metroNIDAZOLE (METROGEL VAGINAL) 0.75 % vaginal gel Place 1 Applicatorful vaginally at bedtime for 5 days. 01/19/19 01/24/19  Fatima Blank, MD  nitrofurantoin, macrocrystal-monohydrate, (MACROBID) 100 MG capsule Take 1 capsule (100 mg total) by mouth 2 (two) times daily. 07/19/17   Doristine Devoid, PA-C  phenazopyridine  (PYRIDIUM) 200 MG tablet Take 1 tablet (200 mg total) by mouth 3 (three) times daily. 07/19/17   Doristine Devoid, PA-C  promethazine (PHENERGAN) 25 MG tablet Take 1 tablet (25 mg total) by mouth every 6 (six) hours as needed for nausea or vomiting. 04/07/17   Fredia Sorrow, MD                                                                                                                                    Past Surgical History Past Surgical History:  Procedure Laterality Date  . CESAREAN SECTION    . TUBAL LIGATION     Family History No family history on file.  Social History Social History   Tobacco Use  . Smoking status: Current Every Day Smoker    Packs/day: 1.00    Types: Cigarettes  . Smokeless tobacco: Never Used  Substance Use Topics  . Alcohol use: Yes    Alcohol/week: 6.0 standard drinks  Types: 6 Cans of beer per week    Comment: 2 (40oz) bottles per day  . Drug use: No   Allergies Percocet [oxycodone-acetaminophen], Tramadol, and Ultram [tramadol hcl]  Review of Systems Review of Systems   All other systems are reviewed and are negative for acute change except as noted in the HPI  Physical Exam Vital Signs  I have reviewed the triage vital signs BP (!) 147/96 (BP Location: Right Arm)   Pulse 80   Temp 98.4 F (36.9 C) (Oral)   Resp 16   Ht 5\' 7"  (1.702 m)   Wt 79.4 kg   SpO2 100%   BMI 27.41 kg/m   Physical Exam Vitals signs reviewed. Exam conducted with a chaperone present.  Constitutional:      General: She is not in acute distress.    Appearance: She is well-developed. She is not diaphoretic.  HENT:     Head: Normocephalic and atraumatic.     Nose: Nose normal.  Eyes:     General: No scleral icterus.       Right eye: No discharge.        Left eye: No discharge.     Conjunctiva/sclera: Conjunctivae normal.     Pupils: Pupils are equal, round, and reactive to light.  Neck:     Musculoskeletal: Normal range of motion and neck supple.   Cardiovascular:     Rate and Rhythm: Normal rate and regular rhythm.     Heart sounds: No murmur. No friction rub. No gallop.   Pulmonary:     Effort: Pulmonary effort is normal. No respiratory distress.     Breath sounds: Normal breath sounds. No stridor. No rales.  Abdominal:     General: There is no distension.     Palpations: Abdomen is soft.     Tenderness: There is no abdominal tenderness.  Genitourinary:    Vagina: No foreign body. No vaginal discharge, erythema or tenderness.     Cervix: Cervical bleeding present. No discharge or erythema.  Musculoskeletal:        General: No tenderness.  Skin:    General: Skin is warm and dry.     Findings: No erythema or rash.  Neurological:     Mental Status: She is alert and oriented to person, place, and time.     ED Results and Treatments Labs (all labs ordered are listed, but only abnormal results are displayed) Labs Reviewed  WET PREP, GENITAL - Abnormal; Notable for the following components:      Result Value   Clue Cells Wet Prep HPF POC PRESENT (*)    WBC, Wet Prep HPF POC MANY (*)    All other components within normal limits  URINALYSIS, ROUTINE W REFLEX MICROSCOPIC - Abnormal; Notable for the following components:   Specific Gravity, Urine >1.030 (*)    Hgb urine dipstick TRACE (*)    All other components within normal limits  URINALYSIS, MICROSCOPIC (REFLEX) - Abnormal; Notable for the following components:   Bacteria, UA FEW (*)    All other components within normal limits  NOVEL CORONAVIRUS, NAA (HOSPITAL ORDER, SEND-OUT TO REF LAB)  PREGNANCY, URINE  GC/CHLAMYDIA PROBE AMP ([place]) NOT AT [facility]  EKG  EKG Interpretation  Date/Time:    Ventricular Rate:    PR Interval:    QRS Duration:   QT Interval:    QTC Calculation:   R Axis:     Text Interpretation:        Radiology No results  found.  Pertinent labs & imaging results that were available during my care of the patient were reviewed by me and considered in my medical decision making (see chart for details).  Medications Ordered in ED Medications - No data to display                                                                                                                                  Procedures Procedures  (including critical care time)  Medical Decision Making / ED Course I have reviewed the nursing notes for this encounter and the patient's prior records (if available in EHR or on provided paperwork).   Rhonda Whitaker was evaluated in Emergency Department on 01/19/2019 for the symptoms described in the history of present illness. She was evaluated in the context of the global COVID-19 pandemic, which necessitated consideration that the patient might be at risk for infection with the SARS-CoV-2 virus that causes COVID-19. Institutional protocols and algorithms that pertain to the evaluation of patients at risk for COVID-19 are in a state of rapid change based on information released by regulatory bodies including the CDC and federal and state organizations. These policies and algorithms were followed during the patient's care in the ED.  The patient appears well, in no acute distress, without evidence of toxicity or dehydration. Abdomen benign. Pelvic exam with mild bleeding from cervix. Wet prep consistent with BV. No Trich. GC/Chlam sent off. No evidence of cervicitis or PID. No empiric Tx needed.   Lung CTAB. No CXR needed at this time. Tested for Covid.  Patient evaluated, tested and sent home with instructions for home care and Quarantine. Instructed to seek further care if symptoms worsen.  Is set up with follow up for a virtual visit with PCP in next 24-48 hours.        Final Clinical Impression(s) / ED Diagnoses Final diagnoses:  Sinus headache  Myalgia  Vaginal odor  Bacterial vaginosis     The patient appears reasonably screened and/or stabilized for discharge and I doubt any other medical condition or other [facility] requiring further screening, evaluation, or treatment in the ED at this time prior to discharge.  Disposition: Discharge  Condition: Good  I have discussed the results, Dx and Tx plan with the patient who expressed understanding and agree(s) with the plan. Discharge instructions discussed at great length. The patient was given strict return precautions who verbalized understanding of the instructions. No further questions at time of discharge.    ED Discharge Orders         Ordered    metroNIDAZOLE (METROGEL VAGINAL) 0.75 % vaginal gel  Daily at  bedtime     01/19/19 0316             This chart was dictated using voice recognition software.  Despite best efforts to proofread,  errors can occur which can change the documentation meaning.   Nira Connardama, Trayven Lumadue Eduardo, MD 01/19/19 [id number]

## 2019-01-19 NOTE — Discharge Instructions (Signed)
  Person Under Monitoring Name: Rhonda Whitaker  Location: [address] [place] [address]   Infection Prevention Recommendations for Individuals Confirmed to have, or Being Evaluated for, 2019 Novel Coronavirus (COVID-19) Infection Who Receive Care at Home  Individuals who are confirmed to have, or are being evaluated for, COVID-19 should follow the prevention steps below until a healthcare provider or local or state health department says they can return to normal activities.  Stay home except to get medical care You should restrict activities outside your home, except for getting medical care. Do not go to work, school, or public areas, and do not use public transportation or taxis.  Call ahead before visiting your doctor Before your medical appointment, call the healthcare provider and tell them that you have, or are being evaluated for, COVID-19 infection. This will help the healthcare provider's office take steps to keep other people from getting infected. Ask your healthcare provider to call the local or state health department.  Monitor your symptoms Seek prompt medical attention if your illness is worsening (e.g., difficulty breathing). Before going to your medical appointment, call the healthcare provider and tell them that you have, or are being evaluated for, COVID-19 infection. Ask your healthcare provider to call the local or state health department.  Wear a facemask You should wear a facemask that covers your nose and mouth when you are in the same room with other people and when you visit a healthcare provider. People who live with or visit you should also wear a facemask while they are in the same room with you.  Separate yourself from other people in your home As much as possible, you should stay in a different room from other people in your home. Also, you should use a separate bathroom, if available.  Avoid sharing household items You should not share  dishes, drinking glasses, cups, eating utensils, towels, bedding, or other items with other people in your home. After using these items, you should wash them thoroughly with soap and water.  Cover your coughs and sneezes Cover your mouth and nose with a tissue when you cough or sneeze, or you can cough or sneeze into your sleeve. Throw used tissues in a lined trash can, and immediately wash your hands with soap and water for at least 20 seconds or use an alcohol-based hand rub.  Wash your hands Wash your hands often and thoroughly with soap and water for at least 20 seconds. You can use an alcohol-based hand sanitizer if soap and water are not available and if your hands are not visibly dirty. Avoid touching your eyes, nose, and mouth with unwashed hands.   Prevention Steps for Caregivers and Household Members of Individuals Confirmed to have, or Being Evaluated for, COVID-19 Infection Being Cared for in the Home  If you live with, or provide care at home for, a person confirmed to have, or being evaluated for, COVID-19 infection please follow these guidelines to prevent infection:  Follow healthcare provider's instructions Make sure that you understand and can help the patient follow any healthcare provider instructions for all care.  Provide for the patient's basic needs You should help the patient with basic needs in the home and provide support for getting groceries, prescriptions, and other personal needs.  Monitor the patient's symptoms If they are getting sicker, call his or her medical provider and tell them that the patient has, or is being evaluated for, COVID-19 infection. This will help the healthcare provider's office   take steps to keep other people from getting infected. °Ask the healthcare provider to call the local or state health department. ° °Limit the number of people who have contact with the patient °If possible, have only one caregiver for the patient. °Other  household members should stay in another home or place of residence. If this is not possible, they should stay °in another room, or be separated from the patient as much as possible. Use a separate bathroom, if available. °Restrict visitors who do not have an essential need to be in the home. ° °Keep older adults, very young children, and other sick people away from the patient °Keep older adults, very young children, and those who have compromised immune systems or chronic health conditions away from the patient. This includes people with chronic heart, lung, or kidney conditions, diabetes, and cancer. ° °Ensure good ventilation °Make sure that shared spaces in the home have good air flow, such as from an air conditioner or an opened window, °weather permitting. ° °Wash your hands often °Wash your hands often and thoroughly with soap and water for at least 20 seconds. You can use an alcohol based hand sanitizer if soap and water are not available and if your hands are not visibly dirty. °Avoid touching your eyes, nose, and mouth with unwashed hands. °Use disposable paper towels to dry your hands. If not available, use dedicated cloth towels and replace them when they become wet. ° °Wear a facemask and gloves °Wear a disposable facemask at all times in the room and gloves when you touch or have contact with the patient’s blood, body fluids, and/or secretions or excretions, such as sweat, saliva, sputum, nasal mucus, vomit, urine, or feces.  Ensure the mask fits over your nose and mouth tightly, and do not touch it during use. °Throw out disposable facemasks and gloves after using them. Do not reuse. °Wash your hands immediately after removing your facemask and gloves. °If your personal clothing becomes contaminated, carefully remove clothing and launder. Wash your hands after handling contaminated clothing. °Place all used disposable facemasks, gloves, and other waste in a lined container before disposing them with  other household waste. °Remove gloves and wash your hands immediately after handling these items. ° °Do not share dishes, glasses, or other household items with the patient °Avoid sharing household items. You should not share dishes, drinking glasses, cups, eating utensils, towels, bedding, or other items with a patient who is confirmed to have, or being evaluated for, COVID-19 infection. °After the person uses these items, you should wash them thoroughly with soap and water. ° °Wash laundry thoroughly °Immediately remove and wash clothes or bedding that have blood, body fluids, and/or secretions or excretions, such as sweat, saliva, sputum, nasal mucus, vomit, urine, or feces, on them. °Wear gloves when handling laundry from the patient. °Read and follow directions on labels of laundry or clothing items and detergent. In general, wash and dry with the warmest temperatures recommended on the label. ° °Clean all areas the individual has used often °Clean all touchable surfaces, such as counters, tabletops, doorknobs, bathroom fixtures, toilets, phones, keyboards, tablets, and bedside tables, every day. Also, clean any surfaces that may have blood, body fluids, and/or secretions or excretions on them. °Wear gloves when cleaning surfaces the patient has come in contact with. °Use a diluted bleach solution (e.g., dilute bleach with 1 part bleach and 10 parts water) or a household disinfectant with a label that says EPA-registered for coronaviruses. To make a bleach   solution at home, add 1 tablespoon of bleach to 1 quart (4 cups) of water. For a larger supply, add  cup of bleach to 1 gallon (16 cups) of water. Read labels of cleaning products and follow recommendations provided on product labels. Labels contain instructions for safe and effective use of the cleaning product including precautions you should take when applying the product, such as wearing gloves or eye protection and making sure you have good ventilation  during use of the product. Remove gloves and wash hands immediately after cleaning.  Monitor yourself for signs and symptoms of illness Caregivers and household members are considered close contacts, should monitor their health, and will be asked to limit movement outside of the home to the extent possible. Follow the monitoring steps for close contacts listed on the symptom monitoring form.   ? If you have additional questions, contact your local health department or call the epidemiologist on call at [phone] (available 24/7). ? This guidance is subject to change. For the most up-to-date guidance from [facility], please refer to their website: [email or web address]

## 2019-01-19 NOTE — ED Triage Notes (Addendum)
Pt c/o headache, body aches, vaginal discharge, loss of smell and taste, intermittent vomiting and diarrhea. Pt reports symptoms x 3 weeks.

## 2019-01-19 NOTE — ED Notes (Signed)
Pt given warm blankets.

## 2019-01-20 LAB — NOVEL CORONAVIRUS, NAA (HOSP ORDER, SEND-OUT TO REF LAB; TAT 18-24 HRS): SARS-CoV-2, NAA: NOT DETECTED

## 2019-01-20 LAB — GC/CHLAMYDIA PROBE AMP (~~LOC~~) NOT AT ARMC
Chlamydia: NEGATIVE
Neisseria Gonorrhea: NEGATIVE

## 2019-07-11 ENCOUNTER — Emergency Department (HOSPITAL_COMMUNITY)
Admission: EM | Admit: 2019-07-11 | Discharge: 2019-07-11 | Disposition: A | Discharge status: Home / Self Care | Payer: Medicaid Other | Attending: Emergency Medicine | Admitting: Emergency Medicine

## 2019-07-11 ENCOUNTER — Encounter (HOSPITAL_COMMUNITY): Payer: Self-pay | Admitting: Pharmacy Technician

## 2019-07-11 ENCOUNTER — Emergency Department (HOSPITAL_COMMUNITY): Payer: Medicaid Other

## 2019-07-11 DIAGNOSIS — F17210 Nicotine dependence, cigarettes, uncomplicated: Secondary | ICD-10-CM | POA: Diagnosis not present

## 2019-07-11 DIAGNOSIS — Y939 Activity, unspecified: Secondary | ICD-10-CM | POA: Diagnosis not present

## 2019-07-11 DIAGNOSIS — S81031A Puncture wound without foreign body, right knee, initial encounter: Secondary | ICD-10-CM | POA: Insufficient documentation

## 2019-07-11 DIAGNOSIS — Y999 Unspecified external cause status: Secondary | ICD-10-CM | POA: Diagnosis not present

## 2019-07-11 DIAGNOSIS — Z79899 Other long term (current) drug therapy: Secondary | ICD-10-CM | POA: Insufficient documentation

## 2019-07-11 DIAGNOSIS — J45909 Unspecified asthma, uncomplicated: Secondary | ICD-10-CM | POA: Insufficient documentation

## 2019-07-11 DIAGNOSIS — Y929 Unspecified place or not applicable: Secondary | ICD-10-CM | POA: Diagnosis not present

## 2019-07-11 DIAGNOSIS — W3400XA Accidental discharge from unspecified firearms or gun, initial encounter: Secondary | ICD-10-CM | POA: Insufficient documentation

## 2019-07-11 DIAGNOSIS — Z23 Encounter for immunization: Secondary | ICD-10-CM | POA: Diagnosis not present

## 2019-07-11 DIAGNOSIS — S8991XA Unspecified injury of right lower leg, initial encounter: Secondary | ICD-10-CM | POA: Diagnosis present

## 2019-07-11 MED ORDER — CEPHALEXIN 250 MG PO CAPS
1000.0000 mg | ORAL_CAPSULE | Freq: Once | ORAL | Status: AC
  Administered 2019-07-11: 1000 mg via ORAL
  Filled 2019-07-11: qty 4

## 2019-07-11 MED ORDER — ACETAMINOPHEN 500 MG PO TABS
1000.0000 mg | ORAL_TABLET | Freq: Once | ORAL | Status: AC
  Administered 2019-07-11: 1000 mg via ORAL
  Filled 2019-07-11: qty 2

## 2019-07-11 MED ORDER — TETANUS-DIPHTH-ACELL PERTUSSIS 5-2.5-18.5 LF-MCG/0.5 IM SUSP
0.5000 mL | Freq: Once | INTRAMUSCULAR | Status: AC
  Administered 2019-07-11: 16:00:00 0.5 mL via INTRAMUSCULAR
  Filled 2019-07-11: qty 0.5

## 2019-07-11 MED ORDER — CEPHALEXIN 500 MG PO CAPS: 500.0000 mg | ORAL_CAPSULE | Freq: Four times a day (QID) | ORAL | 0 refills | Status: AC

## 2019-07-11 NOTE — ED Notes (Signed)
Please call for updates cousin Charm Rings [phone]
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
## 2019-07-11 NOTE — Discharge Instructions (Addendum)
It was our pleasure to provide your ER care today - we hope that you feel better.  Keep wound clean/dry. Wash with soap and water 2x/day, then apply new sterile dressing post cleaning.   Take keflex (antibiotic) as prescribed. Take ibuprofen or acetaminophen as need for pain.   Follow up with primary care doctor, or urgent care, in 2 days time for wound check/recheck.   Return to ER right away if worse, new symptoms, fevers, infection of wound, severe pain, spreading redness, pus from wound, numbness/weakness, or other concern.
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
## 2019-07-11 NOTE — ED Notes (Signed)
Patient Alert and oriented to baseline. Stable and ambulatory to baseline. Patient verbalized understanding of the discharge instructions.  Patient belongings were taken by the patient.   

## 2020-01-23 ENCOUNTER — Encounter (HOSPITAL_BASED_OUTPATIENT_CLINIC_OR_DEPARTMENT_OTHER): Payer: Self-pay

## 2020-01-23 ENCOUNTER — Other Ambulatory Visit: Payer: Self-pay

## 2020-01-23 ENCOUNTER — Emergency Department (HOSPITAL_BASED_OUTPATIENT_CLINIC_OR_DEPARTMENT_OTHER): Payer: Medicaid Other

## 2020-01-23 ENCOUNTER — Emergency Department (HOSPITAL_BASED_OUTPATIENT_CLINIC_OR_DEPARTMENT_OTHER)
Admission: EM | Admit: 2020-01-23 | Discharge: 2020-01-23 | Disposition: A | Discharge status: Home / Self Care | Payer: Medicaid Other | Attending: Emergency Medicine | Admitting: Emergency Medicine

## 2020-01-23 DIAGNOSIS — Z5321 Procedure and treatment not carried out due to patient leaving prior to being seen by health care provider: Secondary | ICD-10-CM | POA: Insufficient documentation

## 2020-01-23 DIAGNOSIS — R202 Paresthesia of skin: Secondary | ICD-10-CM | POA: Insufficient documentation

## 2020-01-23 DIAGNOSIS — R439 Unspecified disturbances of smell and taste: Secondary | ICD-10-CM | POA: Insufficient documentation

## 2020-01-23 NOTE — ED Triage Notes (Signed)
Pt states that she has been having a "weight" on her chest with tingling in her arms. Pt also reports decreased smell and taste.

## 2020-01-30 ENCOUNTER — Other Ambulatory Visit: Payer: Self-pay

## 2020-01-30 DIAGNOSIS — R0981 Nasal congestion: Secondary | ICD-10-CM | POA: Diagnosis present

## 2020-01-30 DIAGNOSIS — R5383 Other fatigue: Secondary | ICD-10-CM | POA: Diagnosis not present

## 2020-01-30 DIAGNOSIS — Z20822 Contact with and (suspected) exposure to covid-19: Secondary | ICD-10-CM | POA: Insufficient documentation

## 2020-01-30 DIAGNOSIS — J45909 Unspecified asthma, uncomplicated: Secondary | ICD-10-CM | POA: Insufficient documentation

## 2020-01-30 DIAGNOSIS — R05 Cough: Secondary | ICD-10-CM | POA: Diagnosis not present

## 2020-01-30 DIAGNOSIS — F17210 Nicotine dependence, cigarettes, uncomplicated: Secondary | ICD-10-CM | POA: Diagnosis not present

## 2020-01-31 ENCOUNTER — Emergency Department (HOSPITAL_BASED_OUTPATIENT_CLINIC_OR_DEPARTMENT_OTHER): Payer: Medicaid Other

## 2020-01-31 ENCOUNTER — Emergency Department (HOSPITAL_BASED_OUTPATIENT_CLINIC_OR_DEPARTMENT_OTHER)
Admission: EM | Admit: 2020-01-31 | Discharge: 2020-01-31 | Disposition: A | Discharge status: Home / Self Care | Payer: Medicaid Other | Attending: Emergency Medicine | Admitting: Emergency Medicine

## 2020-01-31 ENCOUNTER — Encounter (HOSPITAL_BASED_OUTPATIENT_CLINIC_OR_DEPARTMENT_OTHER): Payer: Self-pay | Admitting: Emergency Medicine

## 2020-01-31 ENCOUNTER — Other Ambulatory Visit: Payer: Self-pay

## 2020-01-31 DIAGNOSIS — Z20822 Contact with and (suspected) exposure to covid-19: Secondary | ICD-10-CM

## 2020-01-31 LAB — BASIC METABOLIC PANEL
Anion gap: 11 (ref 5–15)
BUN: 8 mg/dL (ref 6–20)
CO2: 25 mmol/L (ref 22–32)
Calcium: 9.2 mg/dL (ref 8.9–10.3)
Chloride: 103 mmol/L (ref 98–111)
Creatinine, Ser: 0.79 mg/dL (ref 0.44–1.00)
GFR calc Af Amer: >60 mL/min (ref >60)
GFR calc non Af Amer: >60 mL/min (ref >60)
Glucose, Bld: 96 mg/dL (ref 70–99)
Potassium: 3.7 mmol/L (ref 3.5–5.1)
Sodium: 139 mmol/L (ref 135–145)

## 2020-01-31 LAB — CBC WITH DIFFERENTIAL/PLATELET
Abs Immature Granulocytes: 0.02 10*3/uL (ref 0.00–0.07)
Basophils Absolute: 0.1 10*3/uL (ref 0.0–0.1)
Basophils Relative: 1 %
Eosinophils Absolute: 0.1 10*3/uL (ref 0.0–0.5)
Eosinophils Relative: 2 %
HCT: 43.7 % (ref 36.0–46.0)
Hemoglobin: 15.1 g/dL — ABNORMAL HIGH (ref 12.0–15.0)
Immature Granulocytes: 0 %
Lymphocytes Relative: 36 %
Lymphs Abs: 2.9 10*3/uL (ref 0.7–4.0)
MCH: 33 pg (ref 26.0–34.0)
MCHC: 34.6 g/dL (ref 30.0–36.0)
MCV: 95.4 fL (ref 80.0–100.0)
Monocytes Absolute: 0.7 10*3/uL (ref 0.1–1.0)
Monocytes Relative: 9 %
Neutro Abs: 4.2 10*3/uL (ref 1.7–7.7)
Neutrophils Relative %: 52 %
Platelets: 262 10*3/uL (ref 150–400)
RBC: 4.58 MIL/uL (ref 3.87–5.11)
RDW: 13.4 % (ref 11.5–15.5)
WBC: 8.1 10*3/uL (ref 4.0–10.5)
nRBC: 0 % (ref 0.0–0.2)

## 2020-01-31 LAB — TROPONIN I (HIGH SENSITIVITY): Troponin I (High Sensitivity): 2 ng/L (ref <18)

## 2020-01-31 LAB — PREGNANCY, URINE: Preg Test, Ur: NEGATIVE

## 2020-01-31 LAB — SARS CORONAVIRUS 2 BY RT PCR (HOSPITAL ORDER, PERFORMED IN ~~LOC~~ HOSPITAL LAB): SARS Coronavirus 2: NEGATIVE

## 2020-01-31 MED ORDER — IBUPROFEN 800 MG PO TABS
800.0000 mg | ORAL_TABLET | Freq: Once | ORAL | Status: AC
  Administered 2020-01-31: 800 mg via ORAL
  Filled 2020-01-31: qty 1

## 2020-01-31 NOTE — ED Provider Notes (Signed)
[facility] EMERGENCY DEPARTMENT Provider Note   CSN: [id number] Arrival date & time: 01/30/20  2353     History Chief Complaint  Patient presents with  . Chest Pain  . Shortness of Breath    Rhonda Whitaker is a 34 y.o. female.  The history is provided by the patient.  URI Presenting symptoms: congestion, cough and fatigue   Presenting symptoms comment:  Pain in the chest with coughing, did have loss of taste and smell Severity:  Moderate Onset quality:  Gradual Duration:  2 weeks Timing:  Intermittent Progression:  Waxing and waning Chronicity:  New Relieved by:  Nothing Worsened by:  Nothing Ineffective treatments:  None tried Associated symptoms: no headaches and no neck pain   Risk factors: not elderly   No exertional chest pain, no DOE.  Has pain with coughing.  No leg pain or swelling.  Is not covid vaccinated.       Past Medical History:  Diagnosis Date  . Anemia   . Anxiety   . Asthma   . Depression    No meds. currently. Pt. stopped taking  . Fatigue   . Iron deficiency   . Loss of appetite   . Pneumonia     There are no problems to display for this patient.   Past Surgical History:  Procedure Laterality Date  . CESAREAN SECTION    . TUBAL LIGATION       OB History    Gravida  6   Para  5   Term  5   Preterm      AB  1   Living  5     SAB  1   TAB      Ectopic      Multiple  1   Live Births              History reviewed. No pertinent family history.  Social History   Tobacco Use  . Smoking status: Current Every Day Smoker    Packs/day: 1.00    Types: Cigarettes  . Smokeless tobacco: Never Used  Substance Use Topics  . Alcohol use: Yes    Alcohol/week: 6.0 standard drinks    Types: 6 Cans of beer per week    Comment: social  . Drug use: No    Home Medications Prior to Admission medications   Medication Sig Start Date End Date Taking? Authorizing Provider  amoxicillin (AMOXIL) 500 MG  capsule Take 1 capsule (500 mg total) by mouth 3 (three) times daily. 10/14/16   Mackuen, Courteney Lyn, MD  cephALEXin (KEFLEX) 500 MG capsule Take 1 capsule (500 mg total) by mouth 4 (four) times daily. 07/11/19   Cathren Laine, MD  HYDROcodone-acetaminophen (NORCO/VICODIN) 5-325 MG tablet Take 1 tablet by mouth every 6 (six) hours as needed for severe pain. 03/04/16   Zadie Rhine, MD  HYDROcodone-acetaminophen (NORCO/VICODIN) 5-325 MG tablet Take 1-2 tablets by mouth every 6 (six) hours as needed for moderate pain. 04/07/17   Vanetta Mulders, MD  metroNIDAZOLE (FLAGYL) 500 MG tablet Take 1 tablet (500 mg total) by mouth 2 (two) times daily. 05/30/15   Cheri Fowler, PA-C  nitrofurantoin, macrocrystal-monohydrate, (MACROBID) 100 MG capsule Take 1 capsule (100 mg total) by mouth 2 (two) times daily. 07/19/17   Rise Mu, PA-C  phenazopyridine (PYRIDIUM) 200 MG tablet Take 1 tablet (200 mg total) by mouth 3 (three) times daily. 07/19/17   Rise Mu, PA-C  promethazine (PHENERGAN) 25 MG tablet Take  1 tablet (25 mg total) by mouth every 6 (six) hours as needed for nausea or vomiting. 04/07/17   Vanetta Mulders, MD    Allergies    Percocet [oxycodone-acetaminophen], Tramadol, and Ultram [tramadol hcl]  Review of Systems   Review of Systems  Constitutional: Positive for fatigue. Negative for diaphoresis.  HENT: Positive for congestion.   Eyes: Negative for visual disturbance.  Respiratory: Positive for cough.   Cardiovascular: Negative for palpitations and leg swelling.  Gastrointestinal: Negative for abdominal pain.  Genitourinary: Negative for difficulty urinating.  Musculoskeletal: Negative for neck pain.  Neurological: Negative for headaches.  Psychiatric/Behavioral: Negative for agitation.  All other systems reviewed and are negative.   Physical Exam Updated Vital Signs BP (!) 131/92 (BP Location: Right Arm)   Pulse 87   Temp 98.8 F (37.1 C)   Resp 20   Wt 85.4 kg    SpO2 100%   BMI 29.49 kg/m   Physical Exam Vitals and nursing note reviewed.  Constitutional:      General: She is not in acute distress.    Appearance: Normal appearance.  HENT:     Head: Normocephalic and atraumatic.     Nose: Nose normal.  Eyes:     Conjunctiva/sclera: Conjunctivae normal.     Pupils: Pupils are equal, round, and reactive to light.  Cardiovascular:     Rate and Rhythm: Normal rate and regular rhythm.     Pulses: Normal pulses.     Heart sounds: Normal heart sounds.  Pulmonary:     Effort: Pulmonary effort is normal.     Breath sounds: Normal breath sounds.  Abdominal:     General: Abdomen is flat. Bowel sounds are normal.     Tenderness: There is no abdominal tenderness. There is no guarding or rebound.  Musculoskeletal:        General: Normal range of motion.     Cervical back: Normal range of motion and neck supple.  Lymphadenopathy:     Cervical: No cervical adenopathy.  Skin:    General: Skin is warm and dry.     Capillary Refill: Capillary refill takes less than 2 seconds.  Neurological:     General: No focal deficit present.     Mental Status: She is alert and oriented to person, place, and time.  Psychiatric:        Mood and Affect: Mood normal.        Behavior: Behavior normal.     ED Results / Procedures / Treatments   Labs (all labs ordered are listed, but only abnormal results are displayed) Results for orders placed or performed during the hospital encounter of 01/31/20  CBC with Differential/Platelet  Result Value Ref Range   WBC 8.1 4.0 - 10.5 K/uL   RBC 4.58 3.87 - 5.11 MIL/uL   Hemoglobin 15.1 (H) 12.0 - 15.0 g/dL   HCT 69.4 36 - 46 %   MCV 95.4 80.0 - 100.0 fL   MCH 33.0 26.0 - 34.0 pg   MCHC 34.6 30.0 - 36.0 g/dL   RDW 85.4 62.7 - 03.5 %   Platelets 262 150 - 400 K/uL   nRBC 0.0 0.0 - 0.2 %   Neutrophils Relative % 52 %   Neutro Abs 4.2 1.7 - 7.7 K/uL   Lymphocytes Relative 36 %   Lymphs Abs 2.9 0.7 - 4.0 K/uL    Monocytes Relative 9 %   Monocytes Absolute 0.7 0 - 1 K/uL   Eosinophils Relative 2 %  Eosinophils Absolute 0.1 0 - 0 K/uL   Basophils Relative 1 %   Basophils Absolute 0.1 0 - 0 K/uL   Immature Granulocytes 0 %   Abs Immature Granulocytes 0.02 0.00 - 0.07 K/uL  Basic metabolic panel  Result Value Ref Range   Sodium 139 135 - 145 mmol/L   Potassium 3.7 3.5 - 5.1 mmol/L   Chloride 103 98 - 111 mmol/L   CO2 25 22 - 32 mmol/L   Glucose, Bld 96 70 - 99 mg/dL   BUN 8 6 - 20 mg/dL   Creatinine, Ser 2.13 0.44 - 1.00 mg/dL   Calcium 9.2 8.9 - 08.6 mg/dL   GFR calc non Af Amer >60 >60 mL/min   GFR calc Af Amer >60 >60 mL/min   Anion gap 11 5 - 15  Pregnancy, urine  Result Value Ref Range   Preg Test, Ur NEGATIVE NEGATIVE  Troponin I (High Sensitivity)  Result Value Ref Range   Troponin I (High Sensitivity) 2 <18 ng/L   DG Chest 2 View  Result Date: 01/31/2020 CLINICAL DATA:  Chest pain.  Shortness of breath. EXAM: CHEST - 2 VIEW COMPARISON:  08/22/2019 FINDINGS: The heart size and mediastinal contours are within normal limits. Both lungs are clear. The visualized skeletal structures are unremarkable. IMPRESSION: No active cardiopulmonary disease. Electronically Signed   By: Katherine Mantle M.D.   On: 01/31/2020 00:38    EKG EKG Interpretation  Date/Time:  Monday January 31 2020 00:06:21 EDT Ventricular Rate:  85 PR Interval:  162 QRS Duration: 80 QT Interval:  358 QTC Calculation: 426 R Axis:   73 Text Interpretation: Normal sinus rhythm Confirmed by Nicanor Alcon, Laqueshia Cihlar ([id number]) on 01/31/2020 12:56:20 AM   Radiology DG Chest 2 View  Result Date: 01/31/2020 CLINICAL DATA:  Chest pain.  Shortness of breath. EXAM: CHEST - 2 VIEW COMPARISON:  08/22/2019 FINDINGS: The heart size and mediastinal contours are within normal limits. Both lungs are clear. The visualized skeletal structures are unremarkable. IMPRESSION: No active cardiopulmonary disease. Electronically Signed   By: Katherine Mantle M.D.   On: 01/31/2020 00:38    Procedures Procedures (including critical care time)  Medications Ordered in ED Medications  ibuprofen (ADVIL) tablet 800 mg (has no administration in time range)    ED Course  I have reviewed the triage vital signs and the nursing notes.  Pertinent labs & imaging results that were available during my care of the patient were reviewed by me and considered in my medical decision making (see chart for details).    I suspect this is covid.  Giving ongoing symptoms one troponin is sufficient to exclude ACS.  Symptoms are highly atypical for ACS as it is with coughing.  COVID swab was sent and patient was placed in home isolation pending results of covid swab.  There is no indication for admission at this time as patient is saturating 100% on room air.  PERC negative wells 0 highly doubt PE in this patient.  Patient wants to know if she has COPD.  I have instructed her to follow up with her PMD for discussion regarding this.   Chavy Avera was evaluated in Emergency Department on 01/31/2020 for the symptoms described in the history of present illness. She was evaluated in the context of the global COVID-19 pandemic, which necessitated consideration that the patient might be at risk for infection with the SARS-CoV-2 virus that causes COVID-19. Institutional protocols and algorithms that pertain to the evaluation of patients at  risk for COVID-19 are in a state of rapid change based on information released by regulatory bodies including the CDC and federal and state organizations. These policies and algorithms were followed during the patient's care in the ED.  Final Clinical Impression(s) / ED Diagnoses Final diagnoses:  Person under investigation for COVID-19  Return for intractable cough, coughing up blood,fevers >100.4 unrelieved by medication, shortness of breath, intractable vomiting, chest pain, shortness of breath, weakness,numbness, changes in  speech, facial asymmetry,abdominal pain, passing out,Inability to tolerate liquids or food, cough, altered mental status or any concerns. No signs of systemic illness or infection. The patient is nontoxic-appearing on exam and vital signs are within normal limits.   I have reviewed the triage vital signs and the nursing notes. Pertinent labs &imaging results that were available during my care of the patient were reviewed by me and considered in my medical decision making (see chart for details).After history, exam, and medical workup I feel the patient has beenappropriately medically screened and is safe for discharge home. Pertinent diagnoses were discussed with the patient. Patient was given return precautions.   Kalman Nylen, MD 01/31/20 [phone]

## 2020-01-31 NOTE — Discharge Instructions (Addendum)
Person Under Monitoring Name: Rhonda Whitaker  Location: [address] [place] [address]   Infection Prevention Recommendations for Individuals Confirmed to have, or Being Evaluated for, 2019 Novel Coronavirus (COVID-19) Infection Who Receive Care at Home  Individuals who are confirmed to have, or are being evaluated for, COVID-19 should follow the prevention steps below until a healthcare provider or local or state health department says they can return to normal activities.  Stay home except to get medical care You should restrict activities outside your home, except for getting medical care. Do not go to work, school, or public areas, and do not use public transportation or taxis.  Call ahead before visiting your doctor Before your medical appointment, call the healthcare provider and tell them that you have, or are being evaluated for, COVID-19 infection. This will help the healthcare provider's office take steps to keep other people from getting infected. Ask your healthcare provider to call the local or state health department.  Monitor your symptoms Seek prompt medical attention if your illness is worsening (e.g., difficulty breathing). Before going to your medical appointment, call the healthcare provider and tell them that you have, or are being evaluated for, COVID-19 infection. Ask your healthcare provider to call the local or state health department.  Wear a facemask You should wear a facemask that covers your nose and mouth when you are in the same room with other people and when you visit a healthcare provider. People who live with or visit you should also wear a facemask while they are in the same room with you.  Separate yourself from other people in your home As much as possible, you should stay in a different room from other people in your home. Also, you should use a separate bathroom, if available.  Avoid sharing household items You should not share  dishes, drinking glasses, cups, eating utensils, towels, bedding, or other items with other people in your home. After using these items, you should wash them thoroughly with soap and water.  Cover your coughs and sneezes Cover your mouth and nose with a tissue when you cough or sneeze, or you can cough or sneeze into your sleeve. Throw used tissues in a lined trash can, and immediately wash your hands with soap and water for at least 20 seconds or use an alcohol-based hand rub.  Wash your [employer] your hands often and thoroughly with soap and water for at least 20 seconds. You can use an alcohol-based hand sanitizer if soap and water are not available and if your hands are not visibly dirty. Avoid touching your eyes, nose, and mouth with unwashed hands.   Prevention Steps for Caregivers and Household Members of Individuals Confirmed to have, or Being Evaluated for, COVID-19 Infection Being Cared for in the Home  If you live with, or provide care at home for, a person confirmed to have, or being evaluated for, COVID-19 infection please follow these guidelines to prevent infection:  Follow healthcare provider's instructions Make sure that you understand and can help the patient follow any healthcare provider instructions for all care.  Provide for the patient's basic needs You should help the patient with basic needs in the home and provide support for getting groceries, prescriptions, and other personal needs.  Monitor the patient's symptoms If they are getting sicker, call his or her medical provider and tell them that the patient has, or is being evaluated for, COVID-19 infection. This will help the healthcare provider's office  take steps to keep other people from getting infected. Ask the healthcare provider to call the local or state health department.  Limit the number of people who have contact with the patient If possible, have only one caregiver for the patient. Other  household members should stay in another home or place of residence. If this is not possible, they should stay in another room, or be separated from the patient as much as possible. Use a separate bathroom, if available. Restrict visitors who do not have an essential need to be in the home.  Keep older adults, very young children, and other sick people away from the patient Keep older adults, very young children, and those who have compromised immune systems or chronic health conditions away from the patient. This includes people with chronic heart, lung, or kidney conditions, diabetes, and cancer.  Ensure good ventilation Make sure that shared spaces in the home have good air flow, such as from an air conditioner or an opened window, weather permitting.  Wash your hands often Wash your hands often and thoroughly with soap and water for at least 20 seconds. You can use an alcohol based hand sanitizer if soap and water are not available and if your hands are not visibly dirty. Avoid touching your eyes, nose, and mouth with unwashed hands. Use disposable paper towels to dry your hands. If not available, use dedicated cloth towels and replace them when they become wet.  Wear a facemask and gloves Wear a disposable facemask at all times in the room and gloves when you touch or have contact with the patient's blood, body fluids, and/or secretions or excretions, such as sweat, saliva, sputum, nasal mucus, vomit, urine, or feces.  Ensure the mask fits over your nose and mouth tightly, and do not touch it during use. Throw out disposable facemasks and gloves after using them. Do not reuse. Wash your hands immediately after removing your facemask and gloves. If your personal clothing becomes contaminated, carefully remove clothing and launder. Wash your hands after handling contaminated clothing. Place all used disposable facemasks, gloves, and other waste in a lined container before disposing them with  other household waste. Remove gloves and wash your hands immediately after handling these items.  Do not share dishes, glasses, or other household items with the patient Avoid sharing household items. You should not share dishes, drinking glasses, cups, eating utensils, towels, bedding, or other items with a patient who is confirmed to have, or being evaluated for, COVID-19 infection. After the person uses these items, you should wash them thoroughly with soap and water.  Wash laundry thoroughly Immediately remove and wash clothes or bedding that have blood, body fluids, and/or secretions or excretions, such as sweat, saliva, sputum, nasal mucus, vomit, urine, or feces, on them. Wear gloves when handling laundry from the patient. Read and follow directions on labels of laundry or clothing items and detergent. In general, wash and dry with the warmest temperatures recommended on the label.  Clean all areas the individual has used often Clean all touchable surfaces, such as counters, tabletops, doorknobs, bathroom fixtures, toilets, phones, keyboards, tablets, and bedside tables, every day. Also, clean any surfaces that may have blood, body fluids, and/or secretions or excretions on them. Wear gloves when cleaning surfaces the patient has come in contact with. Use a diluted bleach solution (e.g., dilute bleach with 1 part bleach and 10 parts water) or a household disinfectant with a label that says EPA-registered for coronaviruses. To make a bleach  solution at home, add 1 tablespoon of bleach to 1 quart (4 cups) of water. For a larger supply, add  cup of bleach to 1 gallon (16 cups) of water. Read labels of cleaning products and follow recommendations provided on product labels. Labels contain instructions for safe and effective use of the cleaning product including precautions you should take when applying the product, such as wearing gloves or eye protection and making sure you have good ventilation  during use of the product. Remove gloves and wash hands immediately after cleaning.  Monitor yourself for signs and symptoms of illness Caregivers and household members are considered close contacts, should monitor their health, and will be asked to limit movement outside of the home to the extent possible. Follow the monitoring steps for close contacts listed on the symptom monitoring form.   ? If you have additional questions, contact your local health department or call the epidemiologist on call at [phone] (available 24/7). ? This guidance is subject to change. For the most up-to-date guidance from [facility], please refer to their website: [email or web address].pl

## 2020-01-31 NOTE — ED Triage Notes (Signed)
Chest pain and shortness of breath for two weeks. States concerned she has copd. Hx asthma

## 2021-07-11 ENCOUNTER — Other Ambulatory Visit: Payer: Self-pay

## 2021-07-11 ENCOUNTER — Encounter: Payer: Self-pay | Admitting: Neurology

## 2021-07-11 DIAGNOSIS — R202 Paresthesia of skin: Secondary | ICD-10-CM

## 2021-08-16 ENCOUNTER — Other Ambulatory Visit: Payer: Self-pay

## 2021-08-16 ENCOUNTER — Ambulatory Visit: Payer: Medicaid Other | Admitting: Neurology

## 2021-08-16 DIAGNOSIS — R202 Paresthesia of skin: Secondary | ICD-10-CM | POA: Diagnosis not present

## 2021-08-16 DIAGNOSIS — G5601 Carpal tunnel syndrome, right upper limb: Secondary | ICD-10-CM

## 2021-08-16 NOTE — Procedures (Signed)
[facility] Neurology  [address] [place], [address]  [place], [place] [address] Tel: [phone] Fax:  [phone] Test Date:  08/16/2021  Patient: Rhonda Whitaker DOB: 1986-04-30 Physician: Nita Sickle, DO  Sex: Female Height: 5\' 7"  Ref Phys: , MD  ID#: [id number]   Technician:    Patient Complaints: This is a 36 year old female referred for evaluation of bilateral hand pain paresthesias.  NCV & EMG Findings: Electrodiagnostic testing was prematurely terminated at patient's request due to pain.  Findings are as follows:  Bilateral median and ulnar sensory responses show prolonged latency (R6.2, R3.7 ms) and reduced amplitude (R8.8, R11.5 V).  Right radial sensory responses within normal limits. Right median motor response shows prolonged latency (4.1 ms) and normal amplitude.  Right ulnar motor study was incomplete, as patient requested to stop testing.  Impression: This is an incomplete study, as patient requested to terminate testing due to pain.   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 Abnormalities involving the right ulnar nerve cannot be further localized as testing was incomplete. Needle electrode examination was not performed.   ___________________________ 30, DO    Nerve Conduction Studies Anti Sensory Summary Table   Stim Site NR Peak (ms) Norm Peak (ms) P-T Amp (V) Norm P-T Amp  Right Median Anti Sensory (2nd Digit)  32C  Wrist    6.2 <3.4 8.8 >20  Right Radial Anti Sensory (Base 1st Digit)  32C  Wrist    1.9 <2.7 53.0 >18  Right Ulnar Anti Sensory (5th Digit)  32C  Wrist    3.7 <3.1 11.5 >12   Motor Summary Table   Stim Site NR Onset (ms) Norm Onset (ms) O-P Amp (mV) Norm O-P Amp Site1 Site2 Delta-0 (ms) Dist (cm) Vel (m/s) Norm Vel (m/s)  Right Median Motor (Abd Poll Brev)  32C  Wrist    4.1 <3.9 11.2 >6  Elbow Wrist 5.0 29.0 58 >50  Elbow    9.1  10.9         Right Ulnar Motor (Abd Dig Minimi)  32C  Wrist    3.4 <3.1 11.0 >7            Waveforms:

## 2022-06-23 ENCOUNTER — Emergency Department (HOSPITAL_BASED_OUTPATIENT_CLINIC_OR_DEPARTMENT_OTHER): Payer: Medicaid Other

## 2022-06-23 ENCOUNTER — Other Ambulatory Visit: Payer: Self-pay

## 2022-06-23 ENCOUNTER — Emergency Department (HOSPITAL_BASED_OUTPATIENT_CLINIC_OR_DEPARTMENT_OTHER)
Admission: EM | Admit: 2022-06-23 | Discharge: 2022-06-23 | Disposition: A | Discharge status: Home / Self Care | Payer: Medicaid Other | Attending: Emergency Medicine | Admitting: Emergency Medicine

## 2022-06-23 ENCOUNTER — Encounter (HOSPITAL_BASED_OUTPATIENT_CLINIC_OR_DEPARTMENT_OTHER): Payer: Self-pay | Admitting: Emergency Medicine

## 2022-06-23 DIAGNOSIS — M545 Low back pain, unspecified: Secondary | ICD-10-CM | POA: Diagnosis not present

## 2022-06-23 DIAGNOSIS — Z20822 Contact with and (suspected) exposure to covid-19: Secondary | ICD-10-CM | POA: Insufficient documentation

## 2022-06-23 DIAGNOSIS — R051 Acute cough: Secondary | ICD-10-CM

## 2022-06-23 DIAGNOSIS — R059 Cough, unspecified: Secondary | ICD-10-CM | POA: Insufficient documentation

## 2022-06-23 DIAGNOSIS — R0602 Shortness of breath: Secondary | ICD-10-CM | POA: Insufficient documentation

## 2022-06-23 LAB — URINALYSIS, ROUTINE W REFLEX MICROSCOPIC
Bilirubin Urine: NEGATIVE
Glucose, UA: NEGATIVE mg/dL
Hgb urine dipstick: NEGATIVE
Ketones, ur: NEGATIVE mg/dL
Leukocytes,Ua: NEGATIVE
Nitrite: NEGATIVE
Protein, ur: NEGATIVE mg/dL
Specific Gravity, Urine: 1.02 (ref 1.005–1.030)
pH: 5.5 (ref 5.0–8.0)

## 2022-06-23 LAB — BASIC METABOLIC PANEL
Anion gap: 8 (ref 5–15)
BUN: 13 mg/dL (ref 6–20)
CO2: 24 mmol/L (ref 22–32)
Calcium: 9.2 mg/dL (ref 8.9–10.3)
Chloride: 106 mmol/L (ref 98–111)
Creatinine, Ser: 0.83 mg/dL (ref 0.44–1.00)
GFR, Estimated: >60 mL/min (ref >60)
Glucose, Bld: 107 mg/dL — ABNORMAL HIGH (ref 70–99)
Potassium: 4.7 mmol/L (ref 3.5–5.1)
Sodium: 138 mmol/L (ref 135–145)

## 2022-06-23 LAB — CBC WITH DIFFERENTIAL/PLATELET
Abs Immature Granulocytes: 0.04 10*3/uL (ref 0.00–0.07)
Basophils Absolute: 0.1 10*3/uL (ref 0.0–0.1)
Basophils Relative: 1 %
Eosinophils Absolute: 0.2 10*3/uL (ref 0.0–0.5)
Eosinophils Relative: 2 %
HCT: 40.9 % (ref 36.0–46.0)
Hemoglobin: 14.1 g/dL (ref 12.0–15.0)
Immature Granulocytes: 0 %
Lymphocytes Relative: 21 %
Lymphs Abs: 2 10*3/uL (ref 0.7–4.0)
MCH: 31.6 pg (ref 26.0–34.0)
MCHC: 34.5 g/dL (ref 30.0–36.0)
MCV: 91.7 fL (ref 80.0–100.0)
Monocytes Absolute: 1.1 10*3/uL — ABNORMAL HIGH (ref 0.1–1.0)
Monocytes Relative: 11 %
Neutro Abs: 6.3 10*3/uL (ref 1.7–7.7)
Neutrophils Relative %: 65 %
Platelets: 283 10*3/uL (ref 150–400)
RBC: 4.46 MIL/uL (ref 3.87–5.11)
RDW: 13.4 % (ref 11.5–15.5)
WBC: 9.7 10*3/uL (ref 4.0–10.5)
nRBC: 0 % (ref 0.0–0.2)

## 2022-06-23 LAB — RESP PANEL BY RT-PCR (FLU A&B, COVID) ARPGX2
Influenza A by PCR: NEGATIVE
Influenza B by PCR: NEGATIVE
SARS Coronavirus 2 by RT PCR: NEGATIVE

## 2022-06-23 LAB — TROPONIN I (HIGH SENSITIVITY): Troponin I (High Sensitivity): <2 ng/L (ref <18)

## 2022-06-23 LAB — PREGNANCY, URINE: Preg Test, Ur: NEGATIVE

## 2022-06-23 MED ORDER — KETOROLAC TROMETHAMINE 30 MG/ML IJ SOLN
30.0000 mg | Freq: Once | INTRAMUSCULAR | Status: AC
  Administered 2022-06-23: 30 mg via INTRAVENOUS
  Filled 2022-06-23: qty 1

## 2022-06-23 MED ORDER — IOHEXOL 350 MG/ML SOLN
100.0000 mL | Freq: Once | INTRAVENOUS | Status: AC | PRN
  Administered 2022-06-23: 100 mL via INTRAVENOUS

## 2022-06-23 MED ORDER — IBUPROFEN 400 MG PO TABS: 400.0000 mg | ORAL_TABLET | Freq: Four times a day (QID) | ORAL | 0 refills | Status: DC | PRN

## 2022-06-23 NOTE — ED Provider Notes (Signed)
[facility] EMERGENCY DEPARTMENT Provider Note   CSN: [id number] Arrival date & time: 06/23/22  0048     History  Chief Complaint  Patient presents with   Shortness of Breath    Rhonda Whitaker is a 36 y.o. female.  The history is provided by the patient.  Shortness of Breath Severity:  Moderate Onset quality:  Gradual Duration:  4 days Timing:  Constant Progression:  Unchanged Chronicity:  Recurrent Context: URI   Context comment:  Cough and also back pain Relieved by:  Nothing Worsened by:  Nothing Ineffective treatments:  None tried Associated symptoms: cough   Associated symptoms: no abdominal pain, no chest pain, no fever, no sputum production, no vomiting and no wheezing   Risk factors: no hx of PE/DVT, no oral contraceptive use, no prolonged immobilization and no recent surgery   Patient also reports back pain.       Home Medications Prior to Admission medications   Medication Sig Start Date End Date Taking? Authorizing Provider  ibuprofen (ADVIL) 400 MG tablet Take 1 tablet (400 mg total) by mouth every 6 (six) hours as needed. 06/23/22  Yes Darrall Strey, MD  amoxicillin (AMOXIL) 500 MG capsule Take 1 capsule (500 mg total) by mouth 3 (three) times daily. 10/14/16   Mackuen, Courteney Lyn, MD  cephALEXin (KEFLEX) 500 MG capsule Take 1 capsule (500 mg total) by mouth 4 (four) times daily. 07/11/19   Cathren Laine, MD  HYDROcodone-acetaminophen (NORCO/VICODIN) 5-325 MG tablet Take 1 tablet by mouth every 6 (six) hours as needed for severe pain. 03/04/16   Zadie Rhine, MD  HYDROcodone-acetaminophen (NORCO/VICODIN) 5-325 MG tablet Take 1-2 tablets by mouth every 6 (six) hours as needed for moderate pain. 04/07/17   Vanetta Mulders, MD  metroNIDAZOLE (FLAGYL) 500 MG tablet Take 1 tablet (500 mg total) by mouth 2 (two) times daily. 05/30/15   Cheri Fowler, PA-C  nitrofurantoin, macrocrystal-monohydrate, (MACROBID) 100 MG capsule Take 1 capsule (100 mg  total) by mouth 2 (two) times daily. 07/19/17   Rise Mu, PA-C  phenazopyridine (PYRIDIUM) 200 MG tablet Take 1 tablet (200 mg total) by mouth 3 (three) times daily. 07/19/17   Rise Mu, PA-C  promethazine (PHENERGAN) 25 MG tablet Take 1 tablet (25 mg total) by mouth every 6 (six) hours as needed for nausea or vomiting. 04/07/17   Vanetta Mulders, MD      Allergies    Percocet [oxycodone-acetaminophen], Tramadol, and Ultram [tramadol hcl]    Review of Systems   Review of Systems  Constitutional:  Negative for fever.  HENT:  Negative for congestion and facial swelling.   Respiratory:  Positive for cough and shortness of breath. Negative for sputum production and wheezing.   Cardiovascular:  Negative for chest pain and leg swelling.  Gastrointestinal:  Negative for abdominal pain and vomiting.  Genitourinary:  Negative for difficulty urinating.  Musculoskeletal:  Positive for back pain. Negative for gait problem.  Neurological:  Negative for weakness and numbness.  All other systems reviewed and are negative.   Physical Exam Updated Vital Signs BP 114/71 (BP Location: Right Arm)   Pulse 87   Temp 98.2 F (36.8 C)   Resp 20   Ht 5\' 7"  (1.702 m)   Wt 92.1 kg   SpO2 100%   BMI 31.79 kg/m  Physical Exam Vitals and nursing note reviewed.  Constitutional:      General: She is not in acute distress.    Appearance: Normal appearance. She is well-developed.  HENT:     Head: Normocephalic and atraumatic.     Nose: Nose normal.  Eyes:     Pupils: Pupils are equal, round, and reactive to light.  Cardiovascular:     Rate and Rhythm: Normal rate and regular rhythm.     Pulses: Normal pulses.     Heart sounds: Normal heart sounds.  Pulmonary:     Effort: Pulmonary effort is normal. No respiratory distress.     Breath sounds: Normal breath sounds. No stridor. No wheezing, rhonchi or rales.  Abdominal:     General: Bowel sounds are normal. There is no distension.      Palpations: Abdomen is soft.     Tenderness: There is no abdominal tenderness. There is no guarding or rebound.  Genitourinary:    Vagina: No vaginal discharge.  Musculoskeletal:        General: Normal range of motion.     Cervical back: Normal and neck supple.     Thoracic back: Normal.     Lumbar back: Normal.  Skin:    General: Skin is warm and dry.     Capillary Refill: Capillary refill takes less than 2 seconds.     Findings: No erythema or rash.  Neurological:     General: No focal deficit present.     Mental Status: She is alert and oriented to person, place, and time.     Deep Tendon Reflexes: Reflexes normal.  Psychiatric:        Mood and Affect: Mood normal.     ED Results / Procedures / Treatments   Labs (all labs ordered are listed, but only abnormal results are displayed) Results for orders placed or performed during the hospital encounter of 06/23/22  Resp Panel by RT-PCR (Flu A&B, Covid) Anterior Nasal Swab   Specimen: Anterior Nasal Swab  Result Value Ref Range   SARS Coronavirus 2 by RT PCR NEGATIVE NEGATIVE   Influenza A by PCR NEGATIVE NEGATIVE   Influenza B by PCR NEGATIVE NEGATIVE  Urinalysis, Routine w reflex microscopic Urine, Clean Catch  Result Value Ref Range   Color, Urine YELLOW YELLOW   APPearance CLEAR CLEAR   Specific Gravity, Urine 1.020 1.005 - 1.030   pH 5.5 5.0 - 8.0   Glucose, UA NEGATIVE NEGATIVE mg/dL   Hgb urine dipstick NEGATIVE NEGATIVE   Bilirubin Urine NEGATIVE NEGATIVE   Ketones, ur NEGATIVE NEGATIVE mg/dL   Protein, ur NEGATIVE NEGATIVE mg/dL   Nitrite NEGATIVE NEGATIVE   Leukocytes,Ua NEGATIVE NEGATIVE  Pregnancy, urine  Result Value Ref Range   Preg Test, Ur NEGATIVE NEGATIVE  CBC with Differential  Result Value Ref Range   WBC 9.7 4.0 - 10.5 K/uL   RBC 4.46 3.87 - 5.11 MIL/uL   Hemoglobin 14.1 12.0 - 15.0 g/dL   HCT 16.1 09.6 - 04.5 %   MCV 91.7 80.0 - 100.0 fL   MCH 31.6 26.0 - 34.0 pg   MCHC 34.5 30.0 - 36.0  g/dL   RDW 40.9 81.1 - 91.4 %   Platelets 283 150 - 400 K/uL   nRBC 0.0 0.0 - 0.2 %   Neutrophils Relative % 65 %   Neutro Abs 6.3 1.7 - 7.7 K/uL   Lymphocytes Relative 21 %   Lymphs Abs 2.0 0.7 - 4.0 K/uL   Monocytes Relative 11 %   Monocytes Absolute 1.1 (H) 0.1 - 1.0 K/uL   Eosinophils Relative 2 %   Eosinophils Absolute 0.2 0.0 - 0.5 K/uL   Basophils  Relative 1 %   Basophils Absolute 0.1 0.0 - 0.1 K/uL   Immature Granulocytes 0 %   Abs Immature Granulocytes 0.04 0.00 - 0.07 K/uL  Basic metabolic panel  Result Value Ref Range   Sodium 138 135 - 145 mmol/L   Potassium 4.7 3.5 - 5.1 mmol/L   Chloride 106 98 - 111 mmol/L   CO2 24 22 - 32 mmol/L   Glucose, Bld 107 (H) 70 - 99 mg/dL   BUN 13 6 - 20 mg/dL   Creatinine, Ser 1.61 0.44 - 1.00 mg/dL   Calcium 9.2 8.9 - 09.6 mg/dL   GFR, Estimated >04 >54 mL/min   Anion gap 8 5 - 15  Troponin I (High Sensitivity)  Result Value Ref Range   Troponin I (High Sensitivity) <2 <18 ng/L   CT Angio Chest/Abd/Pel for Dissection W and/or Wo Contrast  Result Date: 06/23/2022 CLINICAL DATA:  Acute aortic syndrome suspected. Right middle back pain and shortness of breath for 3 days. EXAM: CT ANGIOGRAPHY CHEST, ABDOMEN AND PELVIS TECHNIQUE: Non-contrast CT of the chest was initially obtained. Multidetector CT imaging through the chest, abdomen and pelvis was performed using the standard protocol during bolus administration of intravenous contrast. Multiplanar reconstructed images and MIPs were obtained and reviewed to evaluate the vascular anatomy. RADIATION DOSE REDUCTION: This exam was performed according to the departmental dose-optimization program which includes automated exposure control, adjustment of the mA and/or kV according to patient size and/or use of iterative reconstruction technique. CONTRAST:  OMNIPAQUE IOHEXOL 350 MG/ML SOLN COMPARISON:  04/07/2017. FINDINGS: CTA CHEST FINDINGS Cardiovascular: The heart is normal in size and  there is no pericardial effusion. No evidence of aortic aneurysm or dissection. Pulmonary trunk is normal in caliber. Mediastinum/Nodes: No mediastinal, hilar, or axillary lymphadenopathy. The thyroid gland, trachea, and esophagus are within normal limits. Lungs/Pleura: Mild atelectasis is present bilaterally. No effusion or pneumothorax. Musculoskeletal: No chest wall abnormality. No acute or significant osseous findings. Review of the MIP images confirms the above findings. CTA ABDOMEN AND PELVIS FINDINGS VASCULAR Aorta: Normal caliber aorta without aneurysm, dissection, vasculitis or significant stenosis. Celiac: Patent without evidence of aneurysm, dissection, vasculitis or significant stenosis. SMA: Patent without evidence of aneurysm, dissection, vasculitis or significant stenosis. The right hepatic artery originates from the SMA. Renals: Both renal arteries are patent without evidence of aneurysm, dissection, vasculitis, fibromuscular dysplasia or significant stenosis. IMA: Patent without evidence of aneurysm, dissection, vasculitis or significant stenosis. Inflow: Patent without evidence of aneurysm, dissection, vasculitis or significant stenosis. Veins: No obvious venous abnormality within the limitations of this arterial phase study. Review of the MIP images confirms the above findings. NON-VASCULAR Hepatobiliary: No focal liver abnormality is seen. Fatty infiltration of the liver is noted. No gallstones, gallbladder wall thickening, or biliary dilatation. Pancreas: Unremarkable. No pancreatic ductal dilatation or surrounding inflammatory changes. Spleen: Normal in size without focal abnormality. Adrenals/Urinary Tract: No adrenal nodule or mass. The kidneys enhance symmetrically. No renal calculus or hydronephrosis. The bladder is unremarkable. Stomach/Bowel: Stomach is within normal limits. Appendix appears normal. No evidence of bowel wall thickening, distention, or inflammatory changes. No free air or  pneumatosis. Lymphatic: No abdominal or pelvic lymphadenopathy. Reproductive: The uterus is within normal limits cystic structures are noted in the adnexal regions bilaterally measuring up to 2.1 cm on the left. No additional imaging is required. Other: Trace amount of free fluid is present in the cul-de-sac which may be physiologic. Musculoskeletal: No acute or suspicious osseous abnormality. Review of the MIP images  confirms the above findings. IMPRESSION: 1. No evidence of aortic aneurysm or dissection. 2. No acute process in the chest, abdomen, or pelvis. 3. Hepatic steatosis. Electronically Signed   By: Thornell SartoriusLaura  Taylor M.D.   On: 06/23/2022 04:33   DG Chest 2 View  Result Date: 06/23/2022 CLINICAL DATA:  Shortness of breath. Right flank pain. Cough. EXAM: CHEST - 2 VIEW COMPARISON:  09/30/2020 FINDINGS: The cardiomediastinal contours are normal. Mild bronchial thickening. Pulmonary vasculature is normal. No consolidation, pleural effusion, or pneumothorax. No acute osseous abnormalities are seen. IMPRESSION: Mild bronchial thickening. Electronically Signed   By: Narda RutherfordMelanie  Sanford M.D.   On: 06/23/2022 01:45    EKG EKG Interpretation  Date/Time:  Sunday June 23 2022 01:23:52 EST Ventricular Rate:  85 PR Interval:  148 QRS Duration: 78 QT Interval:  350 QTC Calculation: 416 R Axis:   46 Text Interpretation: Normal sinus rhythm Confirmed by Nicanor AlconPalumbo, Junella Domke ([id number]) on 06/23/2022 2:13:46 AM  Radiology CT Angio Chest/Abd/Pel for Dissection W and/or Wo Contrast  Result Date: 06/23/2022 CLINICAL DATA:  Acute aortic syndrome suspected. Right middle back pain and shortness of breath for 3 days. EXAM: CT ANGIOGRAPHY CHEST, ABDOMEN AND PELVIS TECHNIQUE: Non-contrast CT of the chest was initially obtained. Multidetector CT imaging through the chest, abdomen and pelvis was performed using the standard protocol during bolus administration of intravenous contrast. Multiplanar reconstructed images and  MIPs were obtained and reviewed to evaluate the vascular anatomy. RADIATION DOSE REDUCTION: This exam was performed according to the departmental dose-optimization program which includes automated exposure control, adjustment of the mA and/or kV according to patient size and/or use of iterative reconstruction technique. CONTRAST:  100mL OMNIPAQUE IOHEXOL 350 MG/ML SOLN COMPARISON:  04/07/2017. FINDINGS: CTA CHEST FINDINGS Cardiovascular: The heart is normal in size and there is no pericardial effusion. No evidence of aortic aneurysm or dissection. Pulmonary trunk is normal in caliber. Mediastinum/Nodes: No mediastinal, hilar, or axillary lymphadenopathy. The thyroid gland, trachea, and esophagus are within normal limits. Lungs/Pleura: Mild atelectasis is present bilaterally. No effusion or pneumothorax. Musculoskeletal: No chest wall abnormality. No acute or significant osseous findings. Review of the MIP images confirms the above findings. CTA ABDOMEN AND PELVIS FINDINGS VASCULAR Aorta: Normal caliber aorta without aneurysm, dissection, vasculitis or significant stenosis. Celiac: Patent without evidence of aneurysm, dissection, vasculitis or significant stenosis. SMA: Patent without evidence of aneurysm, dissection, vasculitis or significant stenosis. The right hepatic artery originates from the SMA. Renals: Both renal arteries are patent without evidence of aneurysm, dissection, vasculitis, fibromuscular dysplasia or significant stenosis. IMA: Patent without evidence of aneurysm, dissection, vasculitis or significant stenosis. Inflow: Patent without evidence of aneurysm, dissection, vasculitis or significant stenosis. Veins: No obvious venous abnormality within the limitations of this arterial phase study. Review of the MIP images confirms the above findings. NON-VASCULAR Hepatobiliary: No focal liver abnormality is seen. Fatty infiltration of the liver is noted. No gallstones, gallbladder wall thickening, or  biliary dilatation. Pancreas: Unremarkable. No pancreatic ductal dilatation or surrounding inflammatory changes. Spleen: Normal in size without focal abnormality. Adrenals/Urinary Tract: No adrenal nodule or mass. The kidneys enhance symmetrically. No renal calculus or hydronephrosis. The bladder is unremarkable. Stomach/Bowel: Stomach is within normal limits. Appendix appears normal. No evidence of bowel wall thickening, distention, or inflammatory changes. No free air or pneumatosis. Lymphatic: No abdominal or pelvic lymphadenopathy. Reproductive: The uterus is within normal limits cystic structures are noted in the adnexal regions bilaterally measuring up to 2.1 cm on the left. No additional imaging is required. Other: Trace  amount of free fluid is present in the cul-de-sac which may be physiologic. Musculoskeletal: No acute or suspicious osseous abnormality. Review of the MIP images confirms the above findings. IMPRESSION: 1. No evidence of aortic aneurysm or dissection. 2. No acute process in the chest, abdomen, or pelvis. 3. Hepatic steatosis. Electronically Signed   By: Thornell Sartorius M.D.   On: 06/23/2022 04:33   DG Chest 2 View  Result Date: 06/23/2022 CLINICAL DATA:  Shortness of breath. Right flank pain. Cough. EXAM: CHEST - 2 VIEW COMPARISON:  09/30/2020 FINDINGS: The cardiomediastinal contours are normal. Mild bronchial thickening. Pulmonary vasculature is normal. No consolidation, pleural effusion, or pneumothorax. No acute osseous abnormalities are seen. IMPRESSION: Mild bronchial thickening. Electronically Signed   By: Narda Rutherford M.D.   On: 06/23/2022 01:45    Procedures Procedures    Medications Ordered in ED Medications  ketorolac (TORADOL) 30 MG/ML injection 30 mg (30 mg Intravenous Given 06/23/22 0238)  iohexol (OMNIPAQUE) 350 MG/ML injection 100 mL (100 mLs Intravenous Contrast Given 06/23/22 0359)    ED Course/ Medical Decision Making/ A&P                            Medical Decision Making Patient with cough, SOB, and low back pain.    Amount and/or Complexity of Data Reviewed External Data Reviewed: notes.    Details: Previous notes reviewed  Labs: ordered.    Details: All labs reviewed: negative covid and flu.  Troponin < 2 in the setting of > 8 hours of symptoms one troponin is sufficient to exclude ACS.  Normal white count 9.7, normal hemoglobin 14.1, normal platelet count.  Urine is negative for UTI.  Normal sodium 138, normal potassium 4.7 normal creatinine .83.   Radiology: ordered and independent interpretation performed.    Details: Negative dissection study by me, lungs clear.   ECG/medicine tests: ordered and independent interpretation performed. Decision-making details documented in ED Course.  Risk Prescription drug management. Risk Details: Exam and vitals are benign and reassuring.  Patient has ruled out for MI with negative EKG and troponin with > 8 hours of symptoms.  Given SOB and back pain patient was also ruled out for UTI and dissection.  No signs of PE.  PERC negative wells 0. There is no pneumonia.   Symptoms are consistent with viral URI with cough and mechanical low back pain.  Patient wants to know which virus and I stated that I could not give her the name, but it is not covid or flu.  Patient is stable for discharge.  Follow up with your family doctor.  Strict return.     Final Clinical Impression(s) / ED Diagnoses Final diagnoses:  Acute low back pain without sciatica, unspecified back pain laterality   Return for intractable cough, coughing up blood, fevers > 100.4 unrelieved by medication, shortness of breath, intractable vomiting, chest pain, shortness of breath, weakness, numbness, changes in speech, facial asymmetry, abdominal pain, passing out, Inability to tolerate liquids or food, cough, altered mental status or any concerns. No signs of systemic illness or infection. The patient is nontoxic-appearing on exam and  vital signs are within normal limits.  I have reviewed the triage vital signs and the nursing notes. Pertinent labs & imaging results that were available during my care of the patient were reviewed by me and considered in my medical decision making (see chart for details). After history, exam, and medical workup I feel the  patient has been appropriately medically screened and is safe for discharge home. Pertinent diagnoses were discussed with the patient. Patient was given return precautions.  Rx / DC Orders ED Discharge Orders          Ordered    ibuprofen (ADVIL) 400 MG tablet  Every 6 hours PRN        06/23/22 0439              Kellan Raffield, MD 06/23/22 1610

## 2022-06-23 NOTE — ED Triage Notes (Addendum)
R middle back pain and shob x 3 days. Hx of CAP. Taking Tylenol and Motrin intermittently. Denies fever. Reports dry cough. Pain worse with inspiration, and movement.

## 2023-01-22 ENCOUNTER — Emergency Department (HOSPITAL_BASED_OUTPATIENT_CLINIC_OR_DEPARTMENT_OTHER): Payer: Medicaid Other

## 2023-01-22 ENCOUNTER — Emergency Department (HOSPITAL_BASED_OUTPATIENT_CLINIC_OR_DEPARTMENT_OTHER)
Admission: EM | Admit: 2023-01-22 | Discharge: 2023-01-22 | Disposition: A | Discharge status: Home / Self Care | Payer: Medicaid Other | Attending: Emergency Medicine | Admitting: Emergency Medicine

## 2023-01-22 ENCOUNTER — Other Ambulatory Visit: Payer: Self-pay

## 2023-01-22 DIAGNOSIS — M79641 Pain in right hand: Secondary | ICD-10-CM | POA: Insufficient documentation

## 2023-01-22 DIAGNOSIS — W1839XA Other fall on same level, initial encounter: Secondary | ICD-10-CM | POA: Diagnosis not present

## 2023-01-22 DIAGNOSIS — F17200 Nicotine dependence, unspecified, uncomplicated: Secondary | ICD-10-CM | POA: Diagnosis not present

## 2023-01-22 DIAGNOSIS — M25531 Pain in right wrist: Secondary | ICD-10-CM | POA: Insufficient documentation

## 2023-01-22 MED ORDER — KETOROLAC TROMETHAMINE 15 MG/ML IJ SOLN: 15.0000 mg | Freq: Once | INTRAMUSCULAR | Status: DC

## 2023-01-22 MED ORDER — KETOROLAC TROMETHAMINE 15 MG/ML IJ SOLN
15.0000 mg | Freq: Once | INTRAMUSCULAR | Status: AC
  Administered 2023-01-22: 15 mg via INTRAMUSCULAR
  Filled 2023-01-22: qty 1

## 2023-01-22 MED ORDER — IBUPROFEN 600 MG PO TABS: 600.0000 mg | ORAL_TABLET | Freq: Four times a day (QID) | ORAL | 0 refills | Status: AC | PRN

## 2023-01-22 NOTE — ED Triage Notes (Signed)
C/O fall last night injuring right hand

## 2023-01-22 NOTE — ED Provider Notes (Signed)
[place] [facility] AT [facility] Provider Note   CSN: [id number] Arrival date & time: 01/22/23  1440     History  Chief Complaint  Patient presents with   Fall   Wrist Pain    Rhonda Whitaker is a 37 y.o. female.   Fall  Wrist Pain   37 year old female presents emergency department with complaints of right-sided wrist/hand pain.  Patient states that she was at her family member's house yesterday and that the chair "came out from under her" and she braced her fall with an outstretched right hand.  States that since then, has had pain as well as swelling.  Has taken no medication for her symptoms.  Denies any weakness or sensory deficits distally.  Denies any known openings/Skin.  Denies Trauma Elsewhere.  Past medical history significant for anemia, iron deficiency, asthma, anxiety, depression  Home Medications Prior to Admission medications   Medication Sig Start Date End Date Taking? Authorizing Provider  ibuprofen (ADVIL) 600 MG tablet Take 1 tablet (600 mg total) by mouth every 6 (six) hours as needed. 01/22/23  Yes Sherian Maroon A, PA  amoxicillin (AMOXIL) 500 MG capsule Take 1 capsule (500 mg total) by mouth 3 (three) times daily. 10/14/16   Mackuen, Courteney Lyn, MD  cephALEXin (KEFLEX) 500 MG capsule Take 1 capsule (500 mg total) by mouth 4 (four) times daily. 07/11/19   Cathren Laine, MD  HYDROcodone-acetaminophen (NORCO/VICODIN) 5-325 MG tablet Take 1 tablet by mouth every 6 (six) hours as needed for severe pain. 03/04/16   Zadie Rhine, MD  HYDROcodone-acetaminophen (NORCO/VICODIN) 5-325 MG tablet Take 1-2 tablets by mouth every 6 (six) hours as needed for moderate pain. 04/07/17   Vanetta Mulders, MD  metroNIDAZOLE (FLAGYL) 500 MG tablet Take 1 tablet (500 mg total) by mouth 2 (two) times daily. 05/30/15   Cheri Fowler, PA-C  nitrofurantoin, macrocrystal-monohydrate, (MACROBID) 100 MG capsule Take 1 capsule (100 mg total) by mouth 2 (two)  times daily. 07/19/17   Rise Mu, PA-C  phenazopyridine (PYRIDIUM) 200 MG tablet Take 1 tablet (200 mg total) by mouth 3 (three) times daily. 07/19/17   Rise Mu, PA-C  promethazine (PHENERGAN) 25 MG tablet Take 1 tablet (25 mg total) by mouth every 6 (six) hours as needed for nausea or vomiting. 04/07/17   Vanetta Mulders, MD      Allergies    Percocet [oxycodone-acetaminophen], Tramadol, and Ultram [tramadol hcl]    Review of Systems   Review of Systems  All other systems reviewed and are negative.   Physical Exam Updated Vital Signs BP (!) 132/92 (BP Location: Left Arm)   Pulse 88   Temp 98.7 F (37.1 C) (Oral)   Resp 16   Ht 5\' 7"  (1.702 m)   Wt 91.7 kg   SpO2 99%   BMI 31.67 kg/m  Physical Exam Vitals and nursing note reviewed.  Constitutional:      General: She is not in acute distress.    Appearance: She is well-developed.  HENT:     Head: Normocephalic and atraumatic.  Eyes:     Conjunctiva/sclera: Conjunctivae normal.  Cardiovascular:     Rate and Rhythm: Normal rate and regular rhythm.     Heart sounds: No murmur heard. Pulmonary:     Effort: Pulmonary effort is normal. No respiratory distress.     Breath sounds: Normal breath sounds.  Abdominal:     Palpations: Abdomen is soft.     Tenderness: There is no abdominal tenderness.  Musculoskeletal:        General: No swelling.     Cervical back: Neck supple.     Comments: Full range of motion of bilateral shoulders, elbows, wrist, digits.  Patient able make fist, thumbs up, abduct her thumb, resist horizontal adduction of digits, extend wrist of right hand.  Radial pulses 2+ bilaterally.  Patient with tenderness to palpation along proximal second and third metacarpal with overlying swelling..  Minimal anatomical snuffbox tenderness but with pain with axial loading of thumb.  No erythema, palpable fluctuance, induration.  Skin:    General: Skin is warm and dry.     Capillary Refill: Capillary  refill takes less than 2 seconds.  Neurological:     Mental Status: She is alert.  Psychiatric:        Mood and Affect: Mood normal.     ED Results / Procedures / Treatments   Labs (all labs ordered are listed, but only abnormal results are displayed) Labs Reviewed - No data to display  EKG None  Radiology DG Wrist Complete Right  Result Date: 01/22/2023 CLINICAL DATA:  Wrist pain after fall EXAM: RIGHT WRIST - COMPLETE 4 VIEW COMPARISON:  None Available. FINDINGS: There is no evidence of fracture or dislocation. There is no evidence of arthropathy or other focal bone abnormality. Soft tissues are unremarkable. IMPRESSION: Negative. Electronically Signed   By: Jacob Moores M.D.   On: 01/22/2023 15:31    Procedures Procedures    Medications Ordered in ED Medications  ketorolac (TORADOL) 15 MG/ML injection 15 mg (15 mg Intramuscular Given 01/22/23 1553)    ED Course/ Medical Decision Making/ A&P                             Medical Decision Making Amount and/or Complexity of Data Reviewed Radiology: ordered.  Risk Prescription drug management.   This patient presents to the ED for concern of wrist pain, this involves an extensive number of treatment options, and is a complaint that carries with it a high risk of complications and morbidity.  The differential diagnosis includes fracture, dislocation, strain/sprain, ligamentous/tendinous injury, neurovascular compromise   Co morbidities that complicate the patient evaluation  See HPI   Additional history obtained:  Additional history obtained from EMR External records from outside source obtained and reviewed including hospital records   Lab Tests:  N/a   Imaging Studies ordered:  I ordered imaging studies including right wrist x-ray I independently visualized and interpreted imaging which showed no acute abnormalities. I agree with the radiologist interpretation   Cardiac Monitoring: / EKG:  The  patient was maintained on a cardiac monitor.  I personally viewed and interpreted the cardiac monitored which showed an underlying rhythm of: Sinus rhythm   Consultations Obtained:  N/a   Problem List / ED Course / Critical interventions / Medication management  Right hand pain I ordered medication including Toradol   Reevaluation of the patient after these medicines showed that the patient improved I have reviewed the patients home medicines and have made adjustments as needed   Social Determinants of Health:  Chronic cigarette use.  Denies illicit drug use.   Test / Admission - Considered:  Right hand pain Vitals signs within normal range and stable throughout visit. Imaging studies significant for: See above 37 year old female presents emergency department after mechanical fall suffered yesterday when falling on an outstretched hand.  Patient with mild anatomical snuffbox tenderness on exam as well  as pain with axial loading of thumb so some concern for underlying scaphoid fracture although with x-ray negative at this time.  Patient without any neurovascular compromise or secondary signs clinically for infectious process.  Will place patient in thumb spica wrist splint and have her follow-up with hand specialist in the outpatient setting.  Symptomatic therapy recommended with rest, ice, elevation, NSAID therapy.  Treatment plan discussed at length with patient and she acknowledged understanding was agreeable to said plan.  Patient overall well-appearing, afebrile, in no acute distress. Worrisome signs and symptoms were discussed with the patient, and the patient acknowledged understanding to return to the ED if noticed. Patient was stable upon discharge.          Final Clinical Impression(s) / ED Diagnoses Final diagnoses:  Right hand pain    Rx / DC Orders ED Discharge Orders          Ordered    ibuprofen (ADVIL) 600 MG tablet  Every 6 hours PRN        01/22/23 1538               Peter Garter, [place] 01/22/23 1616    Charlynne Pander, MD 01/22/23 2231

## 2023-01-22 NOTE — ED Notes (Signed)
Brace applied to right wrist.

## 2023-01-22 NOTE — Discharge Instructions (Signed)
Note the workup today was overall reassuring.  X-ray was negative for any fracture or dislocation.  Given exam, some concern for underlying fracture of your scaphoid bone.  We will put you in a wrist brace and recommend keeping this on until follow-up with a hand specialist in the outpatient setting.  Call number attached your discharge paperwork to schedule an appointment.  Recommend rest, ice, elevation of your hand as well as taking medication the form of Motrin.  Please do not hesitate to return to emergency department if the worrisome signs and symptoms we discussed become apparent.

## 2023-01-28 ENCOUNTER — Emergency Department (HOSPITAL_BASED_OUTPATIENT_CLINIC_OR_DEPARTMENT_OTHER): Payer: Medicaid Other

## 2023-01-28 ENCOUNTER — Emergency Department (HOSPITAL_BASED_OUTPATIENT_CLINIC_OR_DEPARTMENT_OTHER)
Admission: EM | Admit: 2023-01-28 | Discharge: 2023-01-28 | Disposition: A | Discharge status: Home / Self Care | Payer: Medicaid Other | Attending: Emergency Medicine | Admitting: Emergency Medicine

## 2023-01-28 ENCOUNTER — Encounter (HOSPITAL_BASED_OUTPATIENT_CLINIC_OR_DEPARTMENT_OTHER): Payer: Self-pay

## 2023-01-28 ENCOUNTER — Other Ambulatory Visit: Payer: Self-pay

## 2023-01-28 DIAGNOSIS — N941 Unspecified dyspareunia: Secondary | ICD-10-CM | POA: Diagnosis not present

## 2023-01-28 DIAGNOSIS — R102 Pelvic and perineal pain: Secondary | ICD-10-CM

## 2023-01-28 DIAGNOSIS — D259 Leiomyoma of uterus, unspecified: Secondary | ICD-10-CM | POA: Diagnosis not present

## 2023-01-28 LAB — COMPREHENSIVE METABOLIC PANEL
ALT: 15 U/L (ref 0–44)
AST: 19 U/L (ref 15–41)
Albumin: 3.7 g/dL (ref 3.5–5.0)
Alkaline Phosphatase: 78 U/L (ref 38–126)
Anion gap: 7 (ref 5–15)
BUN: 12 mg/dL (ref 6–20)
CO2: 25 mmol/L (ref 22–32)
Calcium: 8.6 mg/dL — ABNORMAL LOW (ref 8.9–10.3)
Chloride: 104 mmol/L (ref 98–111)
Creatinine, Ser: 0.91 mg/dL (ref 0.44–1.00)
GFR, Estimated: >60 mL/min (ref >60)
Glucose, Bld: 102 mg/dL — ABNORMAL HIGH (ref 70–99)
Potassium: 4 mmol/L (ref 3.5–5.1)
Sodium: 136 mmol/L (ref 135–145)
Total Bilirubin: 0.6 mg/dL (ref 0.3–1.2)
Total Protein: 7.7 g/dL (ref 6.5–8.1)

## 2023-01-28 LAB — CBC WITH DIFFERENTIAL/PLATELET
Abs Immature Granulocytes: 0.02 10*3/uL (ref 0.00–0.07)
Basophils Absolute: 0.1 10*3/uL (ref 0.0–0.1)
Basophils Relative: 1 %
Eosinophils Absolute: 0.2 10*3/uL (ref 0.0–0.5)
Eosinophils Relative: 2 %
HCT: 40.2 % (ref 36.0–46.0)
Hemoglobin: 14 g/dL (ref 12.0–15.0)
Immature Granulocytes: 0 %
Lymphocytes Relative: 44 %
Lymphs Abs: 3.3 10*3/uL (ref 0.7–4.0)
MCH: 31.5 pg (ref 26.0–34.0)
MCHC: 34.8 g/dL (ref 30.0–36.0)
MCV: 90.3 fL (ref 80.0–100.0)
Monocytes Absolute: 0.9 10*3/uL (ref 0.1–1.0)
Monocytes Relative: 12 %
Neutro Abs: 3.1 10*3/uL (ref 1.7–7.7)
Neutrophils Relative %: 41 %
Platelets: 299 10*3/uL (ref 150–400)
RBC: 4.45 MIL/uL (ref 3.87–5.11)
RDW: 14 % (ref 11.5–15.5)
WBC: 7.6 10*3/uL (ref 4.0–10.5)
nRBC: 0 % (ref 0.0–0.2)

## 2023-01-28 LAB — HIV ANTIBODY (ROUTINE TESTING W REFLEX): HIV Screen 4th Generation wRfx: NONREACTIVE

## 2023-01-28 LAB — URINALYSIS, ROUTINE W REFLEX MICROSCOPIC
Bilirubin Urine: NEGATIVE
Glucose, UA: NEGATIVE mg/dL
Hgb urine dipstick: NEGATIVE
Ketones, ur: NEGATIVE mg/dL
Leukocytes,Ua: NEGATIVE
Nitrite: NEGATIVE
Protein, ur: NEGATIVE mg/dL
Specific Gravity, Urine: >=1.03 (ref 1.005–1.030)
pH: 5.5 (ref 5.0–8.0)

## 2023-01-28 LAB — WET PREP, GENITAL
Clue Cells Wet Prep HPF POC: NONE SEEN
Sperm: NONE SEEN
Trich, Wet Prep: NONE SEEN
WBC, Wet Prep HPF POC: <10 (ref <10)
Yeast Wet Prep HPF POC: NONE SEEN

## 2023-01-28 LAB — PREGNANCY, URINE: Preg Test, Ur: NEGATIVE

## 2023-01-28 MED ORDER — KETOROLAC TROMETHAMINE 60 MG/2ML IM SOLN
30.0000 mg | Freq: Once | INTRAMUSCULAR | Status: AC
  Administered 2023-01-28: 30 mg via INTRAMUSCULAR
  Filled 2023-01-28: qty 2

## 2023-01-28 MED ORDER — ACETAMINOPHEN 500 MG PO TABS
1000.0000 mg | ORAL_TABLET | Freq: Once | ORAL | Status: AC
  Administered 2023-01-28: 1000 mg via ORAL
  Filled 2023-01-28: qty 2

## 2023-01-28 MED ORDER — FENTANYL CITRATE PF 50 MCG/ML IJ SOSY: 50.0000 ug | PREFILLED_SYRINGE | Freq: Once | INTRAMUSCULAR | Status: DC

## 2023-01-28 NOTE — ED Notes (Signed)
Report given to Bettey Mare RN

## 2023-01-28 NOTE — ED Provider Notes (Signed)
[place] [facility] Provider Note   CSN: [id number] Arrival date & time: 01/28/23  0605     History  Chief Complaint  Patient presents with   Pelvic Pain    Rhonda Whitaker is a 37 y.o. female.   Pelvic Pain     37 year old female with medical history significant for anxiety, depression, iron deficiency anemia, tubal ligation and endometrial ablation who presents to the emergency department with pelvic pain.  The patient states that she has noticed for the past few days pain during intercourse.  She is less concerned for sexually transmitted infections, she has 1 female partner however she consents to testing for sexually transmitted infections.  She denies any vaginal bleeding, abnormal vaginal discharge.  She endorses suprapubic discomfort and bilateral lower abdominal discomfort.  No nausea or vomiting, no fever or chills.  No issues with moving her bowels.  She states that she has no genitourinary symptoms with no dysuria or increased urinary frequency.  She states that "cervical cancer runs in my family and I would like to be tested for that."  She has previously followed up with a gynecologist outpatient has had Pap smears.  She endorses a cramping discomfort that has been present persistently for the last 2 days.  Home Medications Prior to Admission medications   Medication Sig Start Date End Date Taking? Authorizing Provider  amoxicillin (AMOXIL) 500 MG capsule Take 1 capsule (500 mg total) by mouth 3 (three) times daily. 10/14/16   Mackuen, Courteney Lyn, MD  cephALEXin (KEFLEX) 500 MG capsule Take 1 capsule (500 mg total) by mouth 4 (four) times daily. 07/11/19   Cathren Laine, MD  HYDROcodone-acetaminophen (NORCO/VICODIN) 5-325 MG tablet Take 1 tablet by mouth every 6 (six) hours as needed for severe pain. 03/04/16   Zadie Rhine, MD  HYDROcodone-acetaminophen (NORCO/VICODIN) 5-325 MG tablet Take 1-2 tablets by mouth every 6 (six) hours  as needed for moderate pain. 04/07/17   Vanetta Mulders, MD  ibuprofen (ADVIL) 600 MG tablet Take 1 tablet (600 mg total) by mouth every 6 (six) hours as needed. 01/22/23   Peter Garter, PA  metroNIDAZOLE (FLAGYL) 500 MG tablet Take 1 tablet (500 mg total) by mouth 2 (two) times daily. 05/30/15   Cheri Fowler, PA-C  nitrofurantoin, macrocrystal-monohydrate, (MACROBID) 100 MG capsule Take 1 capsule (100 mg total) by mouth 2 (two) times daily. 07/19/17   Rise Mu, PA-C  phenazopyridine (PYRIDIUM) 200 MG tablet Take 1 tablet (200 mg total) by mouth 3 (three) times daily. 07/19/17   Rise Mu, PA-C  promethazine (PHENERGAN) 25 MG tablet Take 1 tablet (25 mg total) by mouth every 6 (six) hours as needed for nausea or vomiting. 04/07/17   Vanetta Mulders, MD      Allergies    Percocet [oxycodone-acetaminophen], Tramadol, and Ultram [tramadol hcl]    Review of Systems   Review of Systems  Genitourinary:  Positive for dyspareunia and pelvic pain.  All other systems reviewed and are negative.   Physical Exam Updated Vital Signs BP (!) 130/90   Pulse 94   Temp 98.6 F (37 C) (Oral)   Resp 18   Ht 5\' 7"  (1.702 m)   Wt 91.6 kg   SpO2 100%   BMI 31.64 kg/m  Physical Exam Vitals and nursing note reviewed. Exam conducted with a chaperone present.  Constitutional:      General: She is not in acute distress.    Appearance: She is well-developed.  HENT:  Head: Normocephalic and atraumatic.  Eyes:     Conjunctiva/sclera: Conjunctivae normal.  Cardiovascular:     Rate and Rhythm: Normal rate and regular rhythm.  Pulmonary:     Effort: Pulmonary effort is normal. No respiratory distress.     Breath sounds: Normal breath sounds.  Abdominal:     Palpations: Abdomen is soft.     Tenderness: There is abdominal tenderness in the suprapubic area. There is no guarding or rebound.  Genitourinary:    Comments: + CMT, right sided adnexal TTP, no significant bleeding or  discharge, cervical tissue appears normal Musculoskeletal:        General: No swelling.     Cervical back: Neck supple.  Skin:    General: Skin is warm and dry.     Capillary Refill: Capillary refill takes less than 2 seconds.  Neurological:     Mental Status: She is alert.  Psychiatric:        Mood and Affect: Mood normal.     ED Results / Procedures / Treatments   Labs (all labs ordered are listed, but only abnormal results are displayed) Labs Reviewed  COMPREHENSIVE METABOLIC PANEL - Abnormal; Notable for the following components:      Result Value   Glucose, Bld 102 (*)    Calcium 8.6 (*)    All other components within normal limits  WET PREP, GENITAL  URINALYSIS, ROUTINE W REFLEX MICROSCOPIC  PREGNANCY, URINE  CBC WITH DIFFERENTIAL/PLATELET  RPR  CBC WITH DIFFERENTIAL/PLATELET  HIV ANTIBODY (ROUTINE TESTING W REFLEX)  GC/CHLAMYDIA PROBE AMP ([place]) NOT AT [facility]    EKG None  Radiology US PELVIC COMPLETE W TRANSVAGINAL AND TORSION R/O  Result Date: 01/28/2023 CLINICAL DATA:  Pelvic pain for 2 weeks. EXAM: TRANSABDOMINAL AND TRANSVAGINAL ULTRASOUND OF PELVIS DOPPLER ULTRASOUND OF OVARIES TECHNIQUE: Both transabdominal and transvaginal ultrasound examinations of the pelvis were performed. Transabdominal technique was performed for global imaging of the pelvis including uterus, ovaries, adnexal regions, and pelvic cul-de-sac. It was necessary to proceed with endovaginal exam following the transabdominal exam to visualize the endometrium and ovaries. Color and duplex Doppler ultrasound was utilized to evaluate blood flow to the ovaries. COMPARISON:  June 21, 2018. FINDINGS: Uterus Measurements: 8.9 x 5.4 x 4.2 cm = volume: 105 mL. No fibroids or other mass visualized. Endometrium Thickness: 2 mm which is within normal limits. 1.3 cm uterine fibroid is noted anteriorly. Right ovary Measurements: 3.3 x 2.7 x 2.3 cm = volume: 11 mL. Normal appearance/no adnexal mass. Left  ovary Not visualized. Pulsed Doppler evaluation of right ovary demonstrates normal low-resistance arterial and venous waveforms. Other findings No abnormal free fluid. IMPRESSION: Small uterine fibroid. Left ovary is not visualized. No evidence of right ovarian mass or torsion. Electronically Signed   By: Lupita Raider M.D.   On: 01/28/2023 08:17    Procedures Procedures    Medications Ordered in ED Medications  acetaminophen (TYLENOL) tablet 1,000 mg (1,000 mg Oral Given 01/28/23 0628)  ketorolac (TORADOL) injection 30 mg (30 mg Intramuscular Given 01/28/23 0741)    ED Course/ Medical Decision Making/ A&P                             Medical Decision Making Amount and/or Complexity of Data Reviewed Labs: ordered. Radiology: ordered.  Risk OTC drugs. Prescription drug management.    37 year old female with medical history significant for anxiety, depression, iron deficiency anemia, tubal ligation and endometrial ablation  who presents to the emergency department with pelvic pain.  The patient states that she has noticed for the past few days pain during intercourse.  She is less concerned for sexually transmitted infections, she has 1 female partner however she consents to testing for sexually transmitted infections.  She denies any vaginal bleeding, abnormal vaginal discharge.  She endorses suprapubic discomfort and bilateral lower abdominal discomfort.  No nausea or vomiting, no fever or chills.  No issues with moving her bowels.  She states that she has no genitourinary symptoms with no dysuria or increased urinary frequency.  She states that "cervical cancer runs in my family and I would like to be tested for that."  She has previously followed up with a gynecologist outpatient has had Pap smears.  On arrival, the patient was afebrile, hemodynamically stable, physical exam significant for right-sided adnexal tenderness and some cervical motion tenderness.  Patient without genitourinary  symptoms, urinalysis negative for UTI, urine pregnancy negative, CBC without a leukocytosis or anemia, Red[facility] negative, CMP unremarkable.  Patient was administered Toradol and Tylenol for pain control.    Pelvic US:  IMPRESSION:  Small uterine fibroid.    Left ovary is not visualized.    No evidence of right ovarian mass or torsion.   Patient with dyspareunia and right-sided adnexal tenderness on exam, some cervical motion tenderness.  Declines STI empiric treatment.  Pelvic ultrasound had revealed a small uterine fibroid which could be causing her crampy discomfort.  No evidence for torsion or other acute emergency requiring inpatient hospitalization.  Recommended that she follow-up outpatient with her gynecologist.  Overall stable at this time for discharge.    Final Clinical Impression(s) / ED Diagnoses Final diagnoses:  Dyspareunia in female  Pelvic pain in female  Uterine leiomyoma, unspecified location    Rx / DC Orders ED Discharge Orders     None         Ernie Avena, MD 01/28/23 [phone]

## 2023-01-28 NOTE — ED Triage Notes (Signed)
Pt reports 2 days of pelvic pain that is worse when walking around. Denies any issues with urination; no discharge. Painful intercourse. Pt would like to be tested for STI's. No issues with BM.

## 2023-01-28 NOTE — Discharge Instructions (Signed)
Your pelvic ultrasound revealed evidence of a small uterine fibroid.  The right ovary was without evidence of mass or torsion.  The left ovary was not visualized but this is not where your pain was on exam.  Overall your workup has been reassuring.  Recommend you follow-up outpatient with your OB/GYN.  The results of your STI testing will be available on the patient portal.  Recommend Tylenol and ibuprofen for pain control.

## 2023-01-28 NOTE — ED Notes (Signed)
 US at bedside

## 2023-01-29 LAB — GC/CHLAMYDIA PROBE AMP (~~LOC~~) NOT AT ARMC
Chlamydia: NEGATIVE
Comment: NEGATIVE
Comment: NORMAL
Neisseria Gonorrhea: NEGATIVE

## 2023-01-29 LAB — RPR: RPR Ser Ql: NONREACTIVE
